# Patient Record
Sex: Male | Born: 1965 | Race: Black or African American | Hispanic: No | Marital: Single | Smoking: Never smoker
Health system: Southern US, Community
[De-identification: ages and names within clinical notes are randomized; demographics above are authoritative.]

## PROBLEM LIST (undated history)

## (undated) DIAGNOSIS — I10 Essential (primary) hypertension: Secondary | ICD-10-CM

## (undated) DIAGNOSIS — M5126 Other intervertebral disc displacement, lumbar region: Secondary | ICD-10-CM

## (undated) DIAGNOSIS — E785 Hyperlipidemia, unspecified: Secondary | ICD-10-CM

## (undated) DIAGNOSIS — E119 Type 2 diabetes mellitus without complications: Secondary | ICD-10-CM

## (undated) HISTORY — PX: TONSILLECTOMY: SUR1361

## (undated) HISTORY — DX: Type 2 diabetes mellitus without complications: E11.9

## (undated) HISTORY — DX: Hyperlipidemia, unspecified: E78.5

## (undated) HISTORY — DX: Essential (primary) hypertension: I10

## (undated) HISTORY — DX: Other intervertebral disc displacement, lumbar region: M51.26

## (undated) HISTORY — PX: APPENDECTOMY: SHX54

---

## 2005-07-30 ENCOUNTER — Emergency Department (HOSPITAL_COMMUNITY): Admission: EM | Admit: 2005-07-30 | Discharge: 2005-07-30 | Payer: Self-pay | Admitting: Emergency Medicine
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
## 2006-09-18 ENCOUNTER — Ambulatory Visit: Payer: Self-pay | Admitting: Family Medicine

## 2006-10-18 ENCOUNTER — Ambulatory Visit: Payer: Self-pay | Admitting: Family Medicine

## 2006-10-19 ENCOUNTER — Encounter (INDEPENDENT_AMBULATORY_CARE_PROVIDER_SITE_OTHER): Payer: Self-pay | Admitting: Family Medicine

## 2006-10-19 LAB — CONVERTED CEMR LAB
Albumin: 4.2 g/dL
BUN: 13 mg/dL
Blood Glucose, Fasting: 100 mg/dL
Calcium: 8.9 mg/dL
Chloride: 106 meq/L
Cholesterol: 174 mg/dL
Creatinine, Ser: 0.85 mg/dL
HCT: 43.9 %
HDL: 30 mg/dL
Hemoglobin: 14.1 g/dL
LDL Cholesterol: 94 mg/dL
MCHC: 32.1 g/dL
MCV: 77 fL
Platelets: 353 10*3/uL
Potassium: 4.3 meq/L
RBC count: 5.7 10*6/uL
RBC: 5.7 M/uL
RDW: 16.1 %
Sodium: 136 meq/L
TSH: 3.166 microintl units/mL
Total Bilirubin: 0.2 mg/dL
Total CHOL/HDL Ratio: 5.8
Total Protein: 7.9 g/dL
Triglycerides: 248 mg/dL
VLDL: 50 mg/dL
WBC, blood: 10.5 10*3/uL
WBC: 10.5 10*3/uL

## 2006-11-22 ENCOUNTER — Ambulatory Visit: Payer: Self-pay | Admitting: Family Medicine

## 2007-01-31 ENCOUNTER — Emergency Department (HOSPITAL_COMMUNITY): Admission: EM | Admit: 2007-01-31 | Discharge: 2007-01-31 | Payer: Self-pay | Admitting: Emergency Medicine

## 2007-02-14 ENCOUNTER — Encounter: Payer: Self-pay | Admitting: Family Medicine

## 2007-02-14 ENCOUNTER — Ambulatory Visit: Payer: Self-pay | Admitting: Family Medicine

## 2007-02-14 DIAGNOSIS — K219 Gastro-esophageal reflux disease without esophagitis: Secondary | ICD-10-CM | POA: Insufficient documentation

## 2007-02-14 DIAGNOSIS — J301 Allergic rhinitis due to pollen: Secondary | ICD-10-CM | POA: Insufficient documentation

## 2007-02-14 DIAGNOSIS — I10 Essential (primary) hypertension: Secondary | ICD-10-CM | POA: Insufficient documentation

## 2007-02-14 DIAGNOSIS — E785 Hyperlipidemia, unspecified: Secondary | ICD-10-CM | POA: Insufficient documentation

## 2007-02-14 LAB — CONVERTED CEMR LAB
Cholesterol, target level: 200 mg/dL
HDL goal, serum: 40 mg/dL
LDL Goal: 160 mg/dL

## 2007-03-18 ENCOUNTER — Encounter (INDEPENDENT_AMBULATORY_CARE_PROVIDER_SITE_OTHER): Payer: Self-pay | Admitting: Family Medicine

## 2007-03-20 ENCOUNTER — Telehealth (INDEPENDENT_AMBULATORY_CARE_PROVIDER_SITE_OTHER): Payer: Self-pay | Admitting: Family Medicine

## 2007-03-22 ENCOUNTER — Encounter (INDEPENDENT_AMBULATORY_CARE_PROVIDER_SITE_OTHER): Payer: Self-pay | Admitting: Family Medicine

## 2007-05-16 ENCOUNTER — Encounter (INDEPENDENT_AMBULATORY_CARE_PROVIDER_SITE_OTHER): Payer: Self-pay | Admitting: Family Medicine

## 2007-05-17 ENCOUNTER — Ambulatory Visit: Payer: Self-pay | Admitting: Family Medicine

## 2007-05-17 DIAGNOSIS — E669 Obesity, unspecified: Secondary | ICD-10-CM | POA: Insufficient documentation

## 2007-05-17 LAB — CONVERTED CEMR LAB: LDL Goal: 130 mg/dL

## 2007-06-10 ENCOUNTER — Encounter (INDEPENDENT_AMBULATORY_CARE_PROVIDER_SITE_OTHER): Payer: Self-pay | Admitting: Family Medicine

## 2007-08-09 ENCOUNTER — Ambulatory Visit: Payer: Self-pay | Admitting: Family Medicine

## 2007-08-16 ENCOUNTER — Encounter (INDEPENDENT_AMBULATORY_CARE_PROVIDER_SITE_OTHER): Payer: Self-pay | Admitting: Family Medicine

## 2007-08-17 LAB — CONVERTED CEMR LAB
ALT: 16 units/L (ref 0–53)
AST: 17 units/L (ref 0–37)
Albumin: 4.2 g/dL (ref 3.5–5.2)
Alkaline Phosphatase: 86 units/L (ref 39–117)
BUN: 10 mg/dL (ref 6–23)
Bilirubin Urine: NEGATIVE
CO2: 22 meq/L (ref 19–32)
Calcium: 9.3 mg/dL (ref 8.4–10.5)
Chloride: 105 meq/L (ref 96–112)
Cholesterol: 172 mg/dL (ref 0–200)
Creatinine, Ser: 0.89 mg/dL (ref 0.40–1.50)
Glucose, Bld: 81 mg/dL (ref 70–99)
HDL: 30 mg/dL — ABNORMAL LOW (ref >39)
Hemoglobin, Urine: NEGATIVE
Ketones, ur: NEGATIVE mg/dL
LDL Cholesterol: 109 mg/dL — ABNORMAL HIGH (ref 0–99)
Leukocytes, UA: NEGATIVE
Nitrite: NEGATIVE
Potassium: 4.3 meq/L (ref 3.5–5.3)
Protein, ur: NEGATIVE mg/dL
Sodium: 139 meq/L (ref 135–145)
Specific Gravity, Urine: 1.025 (ref 1.005–1.03)
Total Bilirubin: 0.3 mg/dL (ref 0.3–1.2)
Total CHOL/HDL Ratio: 5.7
Total Protein: 8 g/dL (ref 6.0–8.3)
Triglycerides: 163 mg/dL — ABNORMAL HIGH (ref <150)
Urine Glucose: NEGATIVE mg/dL
Urobilinogen, UA: 0.2 (ref 0.0–1.0)
VLDL: 33 mg/dL (ref 0–40)
pH: 5.5 (ref 5.0–8.0)

## 2007-08-19 ENCOUNTER — Telehealth (INDEPENDENT_AMBULATORY_CARE_PROVIDER_SITE_OTHER): Payer: Self-pay | Admitting: *Deleted

## 2007-11-08 ENCOUNTER — Ambulatory Visit: Payer: Self-pay | Admitting: Family Medicine

## 2007-11-09 ENCOUNTER — Encounter (INDEPENDENT_AMBULATORY_CARE_PROVIDER_SITE_OTHER): Payer: Self-pay | Admitting: Family Medicine

## 2007-11-11 LAB — CONVERTED CEMR LAB
HCV Ab: NEGATIVE
Hep A IgM: NEGATIVE
Hep B C IgM: NEGATIVE
Hepatitis B Surface Ag: NEGATIVE

## 2007-11-12 LAB — CONVERTED CEMR LAB
Chlamydia, DNA Probe: NEGATIVE
GC Probe Amp, Genital: NEGATIVE

## 2007-11-28 ENCOUNTER — Ambulatory Visit: Payer: Self-pay | Admitting: Family Medicine

## 2008-03-27 ENCOUNTER — Ambulatory Visit: Payer: Self-pay | Admitting: Family Medicine

## 2008-03-28 ENCOUNTER — Encounter (INDEPENDENT_AMBULATORY_CARE_PROVIDER_SITE_OTHER): Payer: Self-pay | Admitting: Family Medicine

## 2008-03-30 LAB — CONVERTED CEMR LAB
BUN: 17 mg/dL (ref 6–23)
CO2: 19 meq/L (ref 19–32)
Calcium: 9.1 mg/dL (ref 8.4–10.5)
Chloride: 104 meq/L (ref 96–112)
Creatinine, Ser: 1.03 mg/dL (ref 0.40–1.50)
Glucose, Bld: 99 mg/dL (ref 70–99)
Potassium: 4.1 meq/L (ref 3.5–5.3)
Sodium: 138 meq/L (ref 135–145)

## 2008-05-01 ENCOUNTER — Ambulatory Visit: Payer: Self-pay | Admitting: Family Medicine

## 2008-07-27 ENCOUNTER — Ambulatory Visit: Payer: Self-pay | Admitting: Family Medicine

## 2008-08-03 ENCOUNTER — Encounter (INDEPENDENT_AMBULATORY_CARE_PROVIDER_SITE_OTHER): Payer: Self-pay | Admitting: Family Medicine

## 2008-08-10 LAB — CONVERTED CEMR LAB
ALT: 22 units/L (ref 0–53)
AST: 19 units/L (ref 0–37)
Albumin: 4.1 g/dL (ref 3.5–5.2)
Alkaline Phosphatase: 83 units/L (ref 39–117)
BUN: 11 mg/dL (ref 6–23)
Basophils Absolute: 0 10*3/uL (ref 0.0–0.1)
Basophils Relative: 0 % (ref 0–1)
CO2: 25 meq/L (ref 19–32)
Calcium: 9.1 mg/dL (ref 8.4–10.5)
Chloride: 100 meq/L (ref 96–112)
Cholesterol: 171 mg/dL (ref 0–200)
Creatinine, Ser: 0.89 mg/dL (ref 0.40–1.50)
Eosinophils Absolute: 0.2 10*3/uL (ref 0.0–0.7)
Eosinophils Relative: 2 % (ref 0–5)
Glucose, Bld: 104 mg/dL — ABNORMAL HIGH (ref 70–99)
HCT: 43.9 % (ref 39.0–52.0)
HDL: 31 mg/dL — ABNORMAL LOW (ref >39)
Hemoglobin: 13.7 g/dL (ref 13.0–17.0)
LDL Cholesterol: 98 mg/dL (ref 0–99)
Lymphocytes Relative: 29 % (ref 12–46)
Lymphs Abs: 2.7 10*3/uL (ref 0.7–4.0)
MCHC: 31.2 g/dL (ref 30.0–36.0)
MCV: 78.7 fL (ref 78.0–100.0)
Monocytes Absolute: 0.7 10*3/uL (ref 0.1–1.0)
Monocytes Relative: 7 % (ref 3–12)
Neutro Abs: 5.8 10*3/uL (ref 1.7–7.7)
Neutrophils Relative %: 63 % (ref 43–77)
PSA: 1.38 ng/mL (ref 0.10–4.00)
Platelets: 352 10*3/uL (ref 150–400)
Potassium: 4.3 meq/L (ref 3.5–5.3)
RBC: 5.58 M/uL (ref 4.22–5.81)
RDW: 16 % — ABNORMAL HIGH (ref 11.5–15.5)
Sodium: 136 meq/L (ref 135–145)
TSH: 3.787 microintl units/mL (ref 0.350–4.50)
Total Bilirubin: 0.2 mg/dL — ABNORMAL LOW (ref 0.3–1.2)
Total CHOL/HDL Ratio: 5.5
Total Protein: 7.9 g/dL (ref 6.0–8.3)
Triglycerides: 208 mg/dL — ABNORMAL HIGH (ref <150)
VLDL: 42 mg/dL — ABNORMAL HIGH (ref 0–40)
WBC: 9.3 10*3/uL (ref 4.0–10.5)

## 2008-09-21 ENCOUNTER — Ambulatory Visit: Payer: Self-pay | Admitting: Family Medicine

## 2008-12-14 ENCOUNTER — Ambulatory Visit: Payer: Self-pay | Admitting: Family Medicine

## 2009-01-16 ENCOUNTER — Emergency Department (HOSPITAL_COMMUNITY): Admission: EM | Admit: 2009-01-16 | Discharge: 2009-01-16 | Payer: Self-pay | Admitting: Emergency Medicine

## 2009-01-18 ENCOUNTER — Ambulatory Visit: Payer: Self-pay | Admitting: Family Medicine

## 2009-01-26 ENCOUNTER — Ambulatory Visit (HOSPITAL_COMMUNITY): Admission: RE | Admit: 2009-01-26 | Discharge: 2009-01-26 | Payer: Self-pay | Admitting: Family Medicine

## 2009-01-26 ENCOUNTER — Ambulatory Visit: Payer: Self-pay | Admitting: Family Medicine

## 2009-03-15 ENCOUNTER — Ambulatory Visit: Payer: Self-pay | Admitting: Family Medicine

## 2009-03-20 ENCOUNTER — Encounter (INDEPENDENT_AMBULATORY_CARE_PROVIDER_SITE_OTHER): Payer: Self-pay | Admitting: Family Medicine

## 2009-03-22 LAB — CONVERTED CEMR LAB
ALT: 26 units/L (ref 0–53)
AST: 20 units/L (ref 0–37)
Albumin: 4.3 g/dL (ref 3.5–5.2)
Alkaline Phosphatase: 76 units/L (ref 39–117)
BUN: 9 mg/dL (ref 6–23)
CO2: 24 meq/L (ref 19–32)
Calcium: 8.8 mg/dL (ref 8.4–10.5)
Chloride: 101 meq/L (ref 96–112)
Cholesterol: 170 mg/dL (ref 0–200)
Creatinine, Ser: 0.92 mg/dL (ref 0.40–1.50)
Glucose, Bld: 103 mg/dL — ABNORMAL HIGH (ref 70–99)
HDL: 30 mg/dL — ABNORMAL LOW (ref >39)
LDL Cholesterol: 98 mg/dL (ref 0–99)
Potassium: 4 meq/L (ref 3.5–5.3)
Sodium: 139 meq/L (ref 135–145)
Total Bilirubin: 0.2 mg/dL — ABNORMAL LOW (ref 0.3–1.2)
Total CHOL/HDL Ratio: 5.7
Total Protein: 7.6 g/dL (ref 6.0–8.3)
Triglycerides: 211 mg/dL — ABNORMAL HIGH (ref <150)
VLDL: 42 mg/dL — ABNORMAL HIGH (ref 0–40)

## 2009-05-03 ENCOUNTER — Ambulatory Visit: Payer: Self-pay | Admitting: Family Medicine

## 2009-07-14 ENCOUNTER — Ambulatory Visit: Payer: Self-pay | Admitting: Family Medicine

## 2009-08-09 LAB — CONVERTED CEMR LAB
ALT: 18 units/L (ref 0–53)
AST: 21 units/L (ref 0–37)
Albumin: 4 g/dL (ref 3.5–5.2)
Alkaline Phosphatase: 75 units/L (ref 39–117)
BUN: 9 mg/dL (ref 6–23)
Basophils Absolute: 0 10*3/uL (ref 0.0–0.1)
Basophils Relative: 0 % (ref 0–1)
CO2: 25 meq/L (ref 19–32)
Calcium: 9.2 mg/dL (ref 8.4–10.5)
Chloride: 101 meq/L (ref 96–112)
Cholesterol: 180 mg/dL (ref 0–200)
Creatinine, Ser: 1.05 mg/dL (ref 0.40–1.50)
Eosinophils Absolute: 0.1 10*3/uL (ref 0.0–0.7)
Eosinophils Relative: 2 % (ref 0–5)
Glucose, Bld: 106 mg/dL — ABNORMAL HIGH (ref 70–99)
HCT: 41.8 % (ref 39.0–52.0)
HDL: 29 mg/dL — ABNORMAL LOW (ref >39)
Hemoglobin: 13.9 g/dL (ref 13.0–17.0)
LDL Cholesterol: 107 mg/dL — ABNORMAL HIGH (ref 0–99)
Lymphocytes Relative: 26 % (ref 12–46)
Lymphs Abs: 2.2 10*3/uL (ref 0.7–4.0)
MCHC: 33.3 g/dL (ref 30.0–36.0)
MCV: 76.1 fL — ABNORMAL LOW (ref 78.0–100.0)
Monocytes Absolute: 0.6 10*3/uL (ref 0.1–1.0)
Monocytes Relative: 7 % (ref 3–12)
Neutro Abs: 5.4 10*3/uL (ref 1.7–7.7)
Neutrophils Relative %: 65 % (ref 43–77)
PSA: 1.09 ng/mL (ref 0.10–4.00)
Platelets: 345 10*3/uL (ref 150–400)
Potassium: 3.8 meq/L (ref 3.5–5.3)
RBC: 5.49 M/uL (ref 4.22–5.81)
RDW: 16.3 % — ABNORMAL HIGH (ref 11.5–15.5)
Sodium: 137 meq/L (ref 135–145)
TSH: 2.561 microintl units/mL (ref 0.350–4.500)
Total Bilirubin: 0.3 mg/dL (ref 0.3–1.2)
Total CHOL/HDL Ratio: 6.2
Total Protein: 7.7 g/dL (ref 6.0–8.3)
Triglycerides: 222 mg/dL — ABNORMAL HIGH (ref <150)
VLDL: 44 mg/dL — ABNORMAL HIGH (ref 0–40)
WBC: 8.3 10*3/uL (ref 4.0–10.5)

## 2009-08-20 ENCOUNTER — Ambulatory Visit: Payer: Self-pay | Admitting: Family Medicine

## 2009-08-20 DIAGNOSIS — R7309 Other abnormal glucose: Secondary | ICD-10-CM | POA: Insufficient documentation

## 2011-01-20 ENCOUNTER — Emergency Department (HOSPITAL_COMMUNITY)
Admission: EM | Admit: 2011-01-20 | Discharge: 2011-01-20 | Payer: Self-pay | Source: Home / Self Care | Admitting: Emergency Medicine

## 2011-01-20 ENCOUNTER — Encounter: Payer: Self-pay | Admitting: Orthopedic Surgery

## 2011-01-24 NOTE — Assessment & Plan Note (Signed)
Summary: STITCHES REMOVED FROM HAND/SLJ   Vital Signs:  Patient Profile:   45 Years Old Male Height:     72 inches O2 Sat:      98 % Pulse rate:   76 / minute Resp:     14 per minute BP sitting:   137 / 86  Vitals Entered By: Sherilyn Banker LPN (January 26, 2009 3:19 PM)                 PCP:  Franchot Heidelberg, MD  Chief Complaint:  needs stitches removed.  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  He was seen with thumb infection and was started on Doxycycline. He has complted course and adds then not sure. Adds mde him constipated and this has cleared. He denies fever, chills and sweats. He denies numbness and tingling and states hand itchy now. He denies drainage and discharge from sutured areas but state area on thumb plantar surface has minimal drainage - states no longer pussy and more serosanguinois. Adds thumb feels a little different and states hard to describe as feels less senstive on dursum.  States has felt this way since he fell. He did not have hand films at ED. He denies pain. He can move fingers without difficulyt. States overall quite a bit better.  He states cannot move right thmb much as swollen. Has been this way since accident.  No pain though.   He is right hand dominant.  He now presents.    Prior Medications Reviewed Using: Patient Recall  Updated Prior Medication List: DILTIAZEM HCL ER BEADS 420 MG  XR24H-CAP (DILTIAZEM HCL ER BEADS) One daily LISINOPRIL-HYDROCHLOROTHIAZIDE 20-25 MG  TABS (LISINOPRIL-HYDROCHLOROTHIAZIDE) One daily DOXYCYCLINE HYCLATE 100 MG TABS (DOXYCYCLINE HYCLATE) two times a day  Current Allergies (reviewed today): No known allergies   Past Medical History:    Reviewed history from 07/27/2008 and no changes required:       SEXUAL ACTIVITY, HIGH RISK (ICD-V69.2)       OBESITY NOS (ICD-278.00)       ALLERGIC RHINITIS, SEASONAL (ICD-477.0)  HYPERLIPIDEMIA (ICD-272.4)       HYPERTENSION (ICD-401.9)       GERD (ICD-530.81)         Past Surgical History:    Reviewed history from 02/14/2007 and no changes required:       Appendectomy-12/19/99       Tonsillectomy    Risk Factors: Tobacco use:  never Drug use:  no Alcohol use:  no  Family History Risk Factors:    Family History of MI in females < 36 years old:  no    Family History of MI in males < 61 years old:  no   Review of Systems      See HPI   Physical Exam  General:     Well-developed,well-nourished,in no acute distress; alert,appropriate and cooperative throughout examination Lungs:     Normal respiratory effort, chest expands symmetrically. Lungs are clear to auscultation, no crackles or wheezes. Heart:     Normal rate and regular rhythm. S1 and S2 normal without gallop, murmur, click, rub or other extra sounds. Msk:     Right hand shows 2 cm half moon lack dorsum of right thumb. Healed. 4 sutures here removed. Good wound approximation. Dry serosanguinous liquid. Palmar surface of thumb shows 1 cm lac with minimal serosanguinous draiange. No infection. His palm shows 2 cm lac  between web space of forefinger and index finger. 5 sutures removed here. Healing. No infection. Good ROM in fingers but cannot flex thumb due to swelling. Normal cap fill and radial/ulnar pulses. Extremities:     No clubbing, cyanosis, edema, or deformity noted with normal full range of motion of all joints.   Cervical Nodes:     No lymphadenopathy noted Psych:     Cognition and judgment appear intact. Alert and cooperative with normal attention span and concentration. No apparent delusions, illusions, hallucinations    Impression & Recommendations:  Problem # 1:  CELLULITIS, HAND, RIGHT (ICD-682.4) Resolved. Assure he completed all abx. Councelled abx ointment two times a day until completely healed. Agrees. His updated medication list for this problem includes:     Doxycycline Hyclate 100 Mg Tabs (Doxycycline hyclate) .Marland Kitchen..Marland Kitchen Two times a day   Problem # 2:  LACERATION, HAND, RIGHT (ICD-882.0) Sutures removed without complication. Rx as above. Update if concerns. Orders: T-DG Finger Thumb*R* ([id number]) EMR Misc Charge Code [facility])   Problem # 3:  THUMB PAIN, RIGHT (ICD-729.5) He has swelling and strange sesnation he cannot pinpoint. He caught himself falling and question thumb sprain vs ligament injury vs occult bony injury. Get plain films. Advised rst, ice, elevation. If symptoms persist in one week, update and refer Ortho. If resolved, update as well . Agrees. Orders: T-DG Finger Thumb*R* ([id number])   Complete Medication List: 1)  Diltiazem Hcl Er Beads 420 Mg Xr24h-cap (Diltiazem hcl er beads) .... One daily 2)  Lisinopril-hydrochlorothiazide 20-25 Mg Tabs (Lisinopril-hydrochlorothiazide) .... One daily 3)  Doxycycline Hyclate 100 Mg Tabs (Doxycycline hyclate) .... Two times a day   Patient Instructions: 1)  Please schedule a follow-up appointment in 1 weeks - sooner if any concerns.

## 2011-01-24 NOTE — Assessment & Plan Note (Signed)
Summary: 6 wk follow up bld work/cnd   Vital Signs:  Patient profile:   45 year old male Height:      72 inches Weight:      288 pounds BMI:     39.20 O2 Sat:      99 % Pulse rate:   78 / minute Resp:     12 per minute BP sitting:   140 / 86  Vitals Entered By: Sherilyn Banker LPN (May 03, 2009 3:59 PM)  Nutrition Counseling: Patient's BMI is greater than 25 and therefore counseled on weight management options. CC: follow-up visit, Hypertension Management, Lipid Management   Primary Provider:  Franchot Heidelberg, MD  CC:  follow-up visit, Hypertension Management, and Lipid Management.  History of Present Illness: Pt in for recheck.  He had recetn labs and this is reviewed:  1. CMP - BS 103. He denies personal hx of DM and notes GM had this. Usnure of ages at diagnosis.  He denies excessive thrist, polyphagia and polydipsia. He is exersizing and states wathcing diet closer. He adds walks 4 miles five days a week. He has lost 3 pounds since last visit.  2. Lipids -  see below. He did not start Niaspan.  Wanted to talk to me first as was unsure as to use.  He now presents.  Hypertension History:      He denies headache, chest pain, palpitations, dyspnea with exertion, orthopnea, PND, peripheral edema, visual symptoms, neurologic problems, syncope, and side effects from treatment.  He notes no problems with any antihypertensive medication side effects.  Further comments include: He is nervous today about not using Niaspan.        Positive major cardiovascular risk factors include hyperlipidemia and hypertension.  Negative major cardiovascular risk factors include male age less than 66 years old, no history of diabetes, negative family history for ischemic heart disease, and non-tobacco-user status.        Further assessment for target organ damage reveals no history of ASHD, stroke/TIA, or peripheral vascular disease.    Lipid Management History:      Positive NCEP/ATP III risk factors  include HDL cholesterol less than 40 and hypertension.  Negative NCEP/ATP III risk factors include male age less than 76 years old, non-diabetic, no family history for ischemic heart disease, non-tobacco-user status, no ASHD (atherosclerotic heart disease), no prior stroke/TIA, no peripheral vascular disease, and no history of aortic aneurysm.        The patient states that he knows about the "Therapeutic Lifestyle Change" diet.  His compliance with the TLC diet is fair.      Preventive Screening-Counseling & Management     Alcohol drinks/day: 0     Smoking Status: never  Current Problems (verified): 1)  Obesity Nos  (ICD-278.00) 2)  Allergic Rhinitis, Seasonal  (ICD-477.0) 3)  Hyperlipidemia  (ICD-272.4) 4)  Hypertension  (ICD-401.9) 5)  Gerd  (ICD-530.81)  Current Medications (verified): 1)  Diltiazem Hcl Er Beads 420 Mg  Xr24h-Cap (Diltiazem Hcl Er Beads) .... One Daily 2)  Lisinopril-Hydrochlorothiazide 20-25 Mg  Tabs (Lisinopril-Hydrochlorothiazide) .... One Daily 3)  Niaspan 500 Mg Cr-Tabs (Niacin (Antihyperlipidemic)) .... Once Daily 4)  Adult Aspirin Ec Low Strength 81 Mg Tbec (Aspirin) .... 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    Single, lives alone - has girlfriend    Never Smoked    Alcohol use-no    Drug use-no     (12/14/2008)  Risk Factors:    Alcohol Use: 0 (03/15/2009)    >5 drinks/d w/in last 3 months: N/A     Caffeine Use: N/A    Diet: N/A    Exercise: N/A  Risk Factors:    Smoking Status: never (03/15/2009)    Packs/Day: N/A    Cigars/wk: N/A    Pipe Use/wk: N/A    Cans of tobacco/wk: N/A    Passive Smoke Exposure: N/A  Family History:    Reviewed history from 12/14/2008 and no changes required:       Father: 32 W&L       Mother: 77 HTN       Siblings: Sisters x 2: 74 and 44 W&L       Brothers - none       KIds - none  Social History:    Reviewed history from 12/14/2008 and no changes required:       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 Resp:  Denies cough, shortness of breath, sputum productive, and wheezing. GI:  Denies abdominal pain, constipation, diarrhea, nausea, and vomiting. GU:  Denies nocturia.  Physical Exam  General:  Well-developed,well-nourished,in no acute distress; alert,appropriate and cooperative throughout examination Lungs:  Normal respiratory effort, chest expands symmetrically. Lungs are clear to auscultation, no crackles or wheezes. Heart:  Normal rate and regular rhythm. S1 and S2 normal without gallop, murmur, click, rub or other extra sounds. Abdomen:  Bowel sounds positive,abdomen soft and non-tender without masses, organomegaly or hernias noted. Extremities:  No clubbing, cyanosis, edema, or deformity noted with normal full range of motion of all joints.  Well healed scars right hand. Cervical Nodes:  No lymphadenopathy noted Psych:  Cognition and judgment appear intact. Alert and cooperative with normal attention span and concentration. No apparent delusions, illusions, hallucinations   Impression & Recommendations:  Problem # 1:  HYPERLIPIDEMIA (ICD-272.4) Discussed. Low HDL for 3 years and [place] above 150 goal. Reviewed ATP III  goals, TLC diet and daily exersize. Add Niaspan. Discussed risk and benefit as well as method of use. Update if side-effects or concerns.  His updated medication list for this problem includes:    Niaspan 500 Mg Cr-tabs (Niacin (antihyperlipidemic)) ..... Once daily  Problem # 2:  HYPERTENSION (ICD-401.9) Rx as below. Nervous today as not taking RX as directed i.e. Niaspan. Reassured. Recheck 2 months.  His updated medication list for this problem includes:    Diltiazem Hcl Er Beads 420 Mg Xr24h-cap (Diltiazem hcl er beads) ..... One daily  Lisinopril-hydrochlorothiazide 20-25 Mg Tabs (Lisinopril-hydrochlorothiazide) ..... One daily  Problem # 3:  OBESITY NOS (ICD-278.00) Happy with weight loss. Urged to stay course. Agreeable. Councelled health risk and benefit of weight loss espescially in lieu of hyperglycemia fasting with fam hx of DM.  Complete Medication List: 1)  Diltiazem Hcl Er Beads 420 Mg Xr24h-cap (Diltiazem hcl er beads) .... One daily 2)  Lisinopril-hydrochlorothiazide 20-25 Mg Tabs (Lisinopril-hydrochlorothiazide) .... One daily 3)  Niaspan 500 Mg Cr-tabs (Niacin (antihyperlipidemic)) .... Once daily 4)  Adult Aspirin Ec Low Strength 81 Mg Tbec (Aspirin) .... Once daily  Hypertension Assessment/Plan:      The patient's hypertensive risk group is category B: At least one risk factor (excluding diabetes) with no target organ damage.  His calculated 10 year risk of coronary heart disease is 6 %.  Today's blood pressure is 140/86.  His blood pressure goal is < 140/90.  Lipid Assessment/Plan:      Based on NCEP/ATP III, the patient's risk factor category is "2 or more risk factors and a calculated 10 year CAD risk of < 20%".  From this information, the patient's calculated lipid goals are as follows: Total cholesterol goal is 200; LDL cholesterol goal is 130; HDL cholesterol goal is 40; Triglyceride goal is 150.    Patient Instructions: 1)  Please schedule a follow-up  appointment in 2 months.

## 2011-01-24 NOTE — Medication Information (Signed)
Summary: Tax adviser   Imported By: Donneta Romberg 06/10/2007 16:08:47  _____________________________________________________________________  External Attachment:    Type:   Image     Comment:   External Document

## 2011-01-24 NOTE — Assessment & Plan Note (Signed)
Summary: right hand wants you to look at it /slj   Vital Signs:  Patient Profile:   45 Years Old Male Height:     72 inches Weight:      292 pounds BMI:     39.75 O2 Sat:      99 % Pulse rate:   82 / minute Resp:     14 per minute BP sitting:   137 / 86  Vitals Entered By: Sherilyn Banker (January 18, 2009 3:28 PM)                 PCP:  Franchot Heidelberg, MD  Chief Complaint:  possible infection and R thumb.  History of Present Illness: Pt in for acute visit.  He states he wants me to look at his right hand. He was walking down steps aty house and fell down the steps - 4 total. He tried to catch self on steps and as he did tore skin open over hand. States went to [facility]. He had 9 sutures placed and was give TDaP injection. He states was told to use abx ointment as well as neosporin. He states was instructed on infection signs and told to update if this occurs.  He states when he chnaged dressing this am saw some puss on a wound on the back of his hand. States was yellow and had blood in it.He states hand hurts and using Hydrocodone for pain. He was seen by Tammy Tripplet. He denies redness and states feels betetr than it has. Just worried with puss and wants me to look at it.  He now presents.    Prior Medications Reviewed Using: Patient Recall  Updated Prior Medication List: DILTIAZEM HCL ER BEADS 420 MG  XR24H-CAP (DILTIAZEM HCL ER BEADS) One daily LISINOPRIL-HYDROCHLOROTHIAZIDE 20-25 MG  TABS (LISINOPRIL-HYDROCHLOROTHIAZIDE) One daily  Current Allergies (reviewed today): No known allergies      Review of Systems      See HPI  General      Denies chills, fever, and sweats.  Resp      Denies cough, shortness of breath, sputum productive, and wheezing.  GI      Denies abdominal pain, constipation, diarrhea, nausea, and vomiting.  GU      Denies nocturia, urinary frequency, and urinary hesitancy.   Physical Exam  General:  Well-developed,well-nourished,in no acute distress; alert,appropriate and cooperative throughout examination Msk:     Right hand shows 2 cm half moon lack dorsum of right thumb. Good wound approximation. Dry serosanguinous liquid. Plamr surface of thum shows 1 cm lac with pussgy fluid draining and mild redness. His palm shows 2 cm lac between web space of forefinger and index finger. One suture already pulled out. No infection here.    Impression & Recommendations:  Problem # 1:  CELLULITIS, HAND, RIGHT (ICD-682.4) Start doxy. Advised method of use and councelled risk and benefit. Susepct secondary to fall and scrape with hand lac. Advised abx ointment two times a day. Avoid soaking hand and wash with soap and water only. Cont to monitor anjd update immediately if worse. Agrees.  His updated medication list for this problem includes:    Doxycycline Hyclate 100 Mg Tabs (Doxycycline hyclate) .Marland Kitchen..Marland Kitchen Two times a day   Problem # 2:  LACERATION, HAND, RIGHT (ICD-882.0) Stable. Rx as above. Cont wound care. Sutures out in 10 days i.e. next Tuesday.   Complete Medication List: 1)  Diltiazem Hcl Er Beads 420 Mg Xr24h-cap (Diltiazem hcl er beads) .Marland KitchenMarland KitchenMarland Kitchen  One daily 2)  Lisinopril-hydrochlorothiazide 20-25 Mg Tabs (Lisinopril-hydrochlorothiazide) .... One daily 3)  Doxycycline Hyclate 100 Mg Tabs (Doxycycline hyclate) .... Two times a day    Prescriptions: DOXYCYCLINE HYCLATE 100 MG TABS (DOXYCYCLINE HYCLATE) two times a day  #14 x 0   Entered and Authorized by:   Franchot Heidelberg MD   Signed by:   Franchot Heidelberg MD on 01/18/2009   Method used:   Print then Give to Patient   RxID:   [id number]    Preventive Care Screening  Last Tetanus Booster:    Date:  01/16/2009    Next Due:  01/2019    Results:  Hx of

## 2011-01-24 NOTE — Assessment & Plan Note (Signed)
Summary: 3 MONTH FOLLOW UP/ARC   Vital Signs:  Patient Profile:   45 Years Old Male Weight:      285 pounds O2 Sat:      98 % Temp:     98 degrees F Pulse rate:   65 / minute Resp:     16 per minute BP sitting:   136 / 88               Visit Type:  Follow-up  Chief Complaint:  F/up.  Hypertension History:      He denies headache, chest pain, palpitations, dyspnea with exertion, orthopnea, PND, peripheral edema, visual symptoms, neurologic problems, syncope, and side effects from treatment.  He notes no problems with any antihypertensive medication side effects.  Further comments include: Had ED visit two weeks ago - heart was racing. Had EKG and heart enzymes - all was normal. Nox sx since.  Just felt anxious. Had a lotof stress from working overtime. Notes he has had this before - just not this bad.        Positive major cardiovascular risk factors include hyperlipidemia and hypertension.  Negative major cardiovascular risk factors include male age less than 60 years old, no history of diabetes, negative family history for ischemic heart disease, and non-tobacco-user status.        Further assessment for target organ damage reveals no history of ASHD, stroke/TIA, or peripheral vascular disease.    Lipid Management History:      Positive NCEP/ATP III risk factors include hypertension.  Negative NCEP/ATP III risk factors include male age less than 93 years old, non-diabetic, no family history for ischemic heart disease, non-tobacco-user status, no ASHD (atherosclerotic heart disease), no prior stroke/TIA, no peripheral vascular disease, and no history of aortic aneurysm.        The patient states that he knows about the "Therapeutic Lifestyle Change" diet.  His compliance with the TLC diet is poor.  The patient expresses understanding of adjunctive measures for cholesterol lowering.  Adjunctive measures started by the patient include aerobic exercise, fiber, and omega-3 supplements.   Comments: Pt not following recommendations. Notes he needs to get on the program. Will try harder. Lipids October: TC 174, Trig 248, HDL 30, LDL 94.    Prior Medications: Current Allergies (reviewed today): No known allergies   Past Surgical History:    Appendectomy-12/19/99    Tonsillectomy   Family History:    Father: 23 W&L    Mother: 73 HTN    Siblings: Sisters x 2: 57 and 64 W&L  Social History:    Occupation: [employer]es     Single    Never Smoked    Alcohol use-no    Drug use-no   Risk Factors:  Tobacco use:  never Drug use:  no Alcohol use:  no  Family History Risk Factors:    Family History of MI in females < 48 years old:  no    Family History of MI in males < 51 years old:  no   Review of Systems  General      Denies chills, fever, and sweats.  CV      Denies chest pain or discomfort, palpitations, swelling of feet, swelling of hands, and weight gain.  Resp      Denies chest discomfort, shortness of breath, sputum productive, and wheezing.  GI      Denies abdominal pain, constipation, diarrhea, nausea, and vomiting.  GU      Denies dysuria, nocturia,  urinary frequency, and urinary hesitancy.  Heme      Hx low MCV - October 007. Level was 77.   Physical Exam  General:     Well-developed,well-nourished,in no acute distress; alert,appropriate and cooperative throughout examination Lungs:     Normal respiratory effort, chest expands symmetrically. Lungs are clear to auscultation, no crackles or wheezes. Heart:     Normal rate and regular rhythm. S1 and S2 normal without gallop, murmur, click, rub or other extra sounds. Abdomen:     Bowel sounds positive,abdomen soft and non-tender without masses, organomegaly or hernias noted. Extremities:     No clubbing, cyanosis, edema, or deformity noted with normal full range of motion of all joints.   Psych:     Cognition and judgment appear intact. Alert and cooperative with normal attention span and  concentration. No apparent delusions, illusions, hallucinations    Impression & Recommendations:  Problem # 1:  HYPERTENSION (ICD-401.9) At goal. No change. Diet, exersize and low salt intake a must. If palpitations reoccur, will need holter etc. His updated medication list for this problem includes:    Cardizem La 420 Mg Tb24 (Diltiazem hcl coated beads) ..... One by mouth daily    Micardis Hct 40-12.5 Mg Tabs (Telmisartan-hctz) ..... One by mouth daily   Problem # 2:  HYPERLIPIDEMIA (ICD-272.4) TLC for three months. Then needs repeat CMPand Lipidpanel.  Problem # 3:  GERD (ICD-530.81) Stable. No change  Problem # 4:  ALLERGIC RHINITIS, SEASONAL (ICD-477.0) Sx Rx OTC as needed i.e. Zyrtec, Claritin etc.   Medications Added to Medication List This Visit: 1)  Cardizem La 420 Mg Tb24 (Diltiazem hcl coated beads) .... One by mouth daily 2)  Micardis Hct 40-12.5 Mg Tabs (Telmisartan-hctz) .... One by mouth daily  Hypertension Assessment/Plan:      The patient's hypertensive risk group is category B: At least one risk factor (excluding diabetes) with no target organ damage.  Today's blood pressure is 136/88.  His blood pressure goal is < 140/90.  Lipid Assessment/Plan:      Based on NCEP/ATP III, the patient's risk factor category is "0-1 risk factors".  From this information, the patient's calculated lipid goals are as follows: Total cholesterol goal is 200; LDL cholesterol goal is 160; HDL cholesterol goal is 40; Triglyceride goal is 150.

## 2011-01-24 NOTE — Progress Notes (Signed)
Summary: 08/16/07 lab reslults  Phone Note Outgoing Call   Call placed by: Sonny Dandy,  August 19, 2007 2:42 PM Summary of Call: called, no answer, message left for patient to return call on answering machine .................................................................Marland KitchenMarland KitchenSonny Dandy  August 19, 2007 2:42 PM   Results given to patient, voices understanding .................................................................Marland KitchenMarland KitchenSonny Dandy  August 19, 2007 3:42 PM  Initial call taken by: Sonny Dandy,  August 19, 2007 3:42 PM

## 2011-01-24 NOTE — Assessment & Plan Note (Signed)
Summary: "crick" in neck x1 week...sg   Vital Signs:  Patient Profile:   45 Years Old Male Height:     72 inches O2 Sat:      98 % Pulse rate:   63 / minute Resp:     12 per minute BP sitting:   157 / 86  Vitals Entered By: Sherilyn Banker (May 01, 2008 2:23 PM)                 PCP:  Franchot Heidelberg, MD  Chief Complaint:  necl stiffness since Monday.  History of Present Illness: Pt in for acute visit.  He has had neck pain since Monday. States right side is aweful. Describes as a sharp pain. Worse with turning neck. Better with sitting dead still and not moving it. States localized. Rates pain as 5/10.  He states he went to sleep Sunday night and was fine, woke up Monday am and had pain. He sleeps on regular pillow and has 3 to 4 of them on bed. He denies headache, tremor and weakness. No injury or trauma. States has not self treated except for mucle rub - freeze. Not helping. May ease little bit.  He now presents.    Prior Medications Reviewed Using: Patient Recall  Updated Prior Medication List: CARDIZEM LA 420 MG TB24 (DILTIAZEM HCL COATED BEADS) One by mouth daily MICARDIS HCT 40-12.5 MG TABS (TELMISARTAN-HCTZ) One by mouth daily  Current Allergies (reviewed today): No known allergies   Past Medical History:    Reviewed history from 03/27/2008 and no changes required:              SEXUAL ACTIVITY, HIGH RISK (ICD-V69.2)       OBESITY NOS (ICD-278.00)       ALLERGIC RHINITIS, SEASONAL (ICD-477.0)       HYPERLIPIDEMIA (ICD-272.4)       HYPERTENSION (ICD-401.9)       GERD (ICD-530.81)         Past Surgical History:    Reviewed history from 02/14/2007 and no changes required:       Appendectomy-12/19/99       Tonsillectomy   Family History:    Reviewed history from 02/14/2007 and no changes required:       Father: 51 W&L       Mother: 31 HTN       Siblings: Sisters x 2: 24 and 25 W&L  Social History:    Reviewed history from 02/14/2007 and no changes  required:       Occupation: [employer]        Single       Never Smoked       Alcohol use-no       Drug use-no   Risk Factors: Tobacco use:  never Drug use:  no Alcohol use:  no  Family History Risk Factors:    Family History of MI in females < 78 years old:  no    Family History of MI in males < 36 years old:  no   Review of Systems      See HPI   Physical Exam  General:     Well-developed,well-nourished,in no acute distress; alert,appropriate and cooperative throughout examination Neck:     Pain right neck. Paraspinous muscle spasm. LImited ROM due to pain. Acne changes noted. Lungs:     Normal respiratory effort, chest expands symmetrically. Lungs are clear to auscultation, no crackles or wheezes. Heart:     Normal rate and regular  rhythm. S1 and S2 normal without gallop, murmur, click, rub or other extra sounds. Abdomen:     Bowel sounds positive,abdomen soft and non-tender without masses, organomegaly or hernias noted. Extremities:     No clubbing, cyanosis, edema, or deformity noted with normal full range of motion of all joints.   Neurologic:     No cranial nerve deficits noted. Station and gait are normal. Plantar reflexes are down-going bilaterally. DTRs are symmetrical throughout. Sensory, motor and coordinative functions appear intact.    Impression & Recommendations:  Problem # 1:  CERVICAL STRAIN (ICD-847.0) Start Flexeril,NSAID and give single dose Toradol. Advised risk and benefit of med use including sedation. Councelled importance of proper pillowuse as sxlikley secondary to sleeping wrong way. Advised [place] last weekend and was exhausted afterwards. Agrees with me he slept wrong as he was so tired. Update if any concern with meds or if sx do not improve. Gradually increase neck ROM exerisze. If persist, refer PT. His updated medication list for this problem includes:    Cyclobenzaprine Hcl 10 Mg Tabs (Cyclobenzaprine hcl) ..... One  three times a day as need for mucle sprain    Ec-naprosyn 500 Mg Tbec (Naproxen) .Marland Kitchen..Marland Kitchen Two times a day  Orders: Ketorolac-Toradol 15mg  (P2951) Admin of Therapeutic Inj  intramuscular or subcutaneous (88416)   Complete Medication List: 1)  Cardizem La 420 Mg Tb24 (Diltiazem hcl coated beads) .... One by mouth daily 2)  Micardis Hct 40-12.5 Mg Tabs (Telmisartan-hctz) .... One by mouth daily 3)  Cyclobenzaprine Hcl 10 Mg Tabs (Cyclobenzaprine hcl) .... One three times a day as need for mucle sprain 4)  Ec-naprosyn 500 Mg Tbec (Naproxen) .... Two times a day   Patient Instructions: 1)  As scheduled. Sooner if worse.   Prescriptions: EC-NAPROSYN 500 MG  TBEC (NAPROXEN) two times a day  #20 x 0   Entered and Authorized by:   Franchot Heidelberg MD   Signed by:   Franchot Heidelberg MD on 05/01/2008   Method used:   Print then Give to Patient   RxID:   [id number] CYCLOBENZAPRINE HCL 10 MG  TABS (CYCLOBENZAPRINE HCL) One three times a day as need for mucle sprain  #15 x 0   Entered and Authorized by:   Franchot Heidelberg MD   Signed by:   Franchot Heidelberg MD on 05/01/2008   Method used:   Print then Give to Patient   RxID:   [id number]  ]  Medication Administration  Injection # 1:    Medication: Ketorolac-Toradol 15mg     Diagnosis: CERVICAL STRAIN (ICD-847.0)    Route: IM    Site: L thigh    Exp Date: 03/25/2009    Mfr: Abraxis    Patient tolerated injection without complications    Given by: Sherilyn Banker (May 01, 2008 3:09 PM)  Orders Added: 1)  Est. Patient Level III [28315] 2)  Ketorolac-Toradol 15mg  [J1885] 3)  Admin of Therapeutic Inj  intramuscular or subcutaneous [17616]

## 2011-01-24 NOTE — Medication Information (Signed)
Summary: cardizem refill  cardizem refill   Imported By: Magdalene River 03/22/2007 10:39:38  _____________________________________________________________________  External Attachment:    Type:   Image     Comment:   External Document

## 2011-01-24 NOTE — Assessment & Plan Note (Signed)
Summary: 3 month follow up/arc   Vital Signs:  Patient Profile:   45 Years Old Male Height:     72 inches Weight:      293 pounds BMI:     39.88 O2 Sat:      97 % Pulse rate:   87 / minute Resp:     14 per minute BP sitting:   140 / 89  Vitals Entered By: Sherilyn Banker (December 14, 2008 3:04 PM)                 PCP:  Franchot Heidelberg, MD  Chief Complaint:  follow up visit.  History of Present Illness: Pt in for recheck.  He notes he is doing well. He states he just needs BP check and denies any concerns.  He states he finally got new glasses and makes world of difference. He is happy with this and states last eye exam few months ago. Denies signs of retinopathy.  He has not seen dentist.  He denies chest pain, SOB, orthopnea, PND, and palpitations. He is watching salt. He is walking for exersize and states does this daily - 2 to 3 miles and does every day.   He is on generic medications and states insurance gives them to him free.  He now presents.    Hypertension History:      He denies headache, chest pain, palpitations, dyspnea with exertion, orthopnea, PND, peripheral edema, visual symptoms, neurologic problems, syncope, and side effects from treatment.  He notes no problems with any antihypertensive medication side effects.  Further comments include: See HPI.        Positive major cardiovascular risk factors include hyperlipidemia and hypertension.  Negative major cardiovascular risk factors include male age less than 58 years old, no history of diabetes, negative family history for ischemic heart disease, and non-tobacco-user status.        Further assessment for target organ damage reveals no history of ASHD, stroke/TIA, or peripheral vascular disease.    Lipid Management History:      Positive NCEP/ATP III risk factors include HDL cholesterol less than 40 and hypertension.  Negative NCEP/ATP III risk factors include male age less than 107 years old, non-diabetic,  no family history for ischemic heart disease, non-tobacco-user status, no ASHD (atherosclerotic heart disease), no prior stroke/TIA, no peripheral vascular disease, and no history of aortic aneurysm.        The patient states that he knows about the "Therapeutic Lifestyle Change" diet.  His compliance with the TLC diet is fair.  The patient expresses understanding of adjunctive measures for cholesterol lowering.  Comments: He has modified diet. He is exersizing. He is eating molre oatmeal and cheerios. He states has not started flaxseed or fish oil. .      Prior Medications Reviewed Using: Patient Recall  Updated Prior Medication List: DILTIAZEM HCL ER BEADS 420 MG  XR24H-CAP (DILTIAZEM HCL ER BEADS) One daily LISINOPRIL-HYDROCHLOROTHIAZIDE 20-25 MG  TABS (LISINOPRIL-HYDROCHLOROTHIAZIDE) One daily  Current Allergies (reviewed today): No known allergies   Past Medical History:    Reviewed history from 07/27/2008 and no changes required:       SEXUAL ACTIVITY, HIGH RISK (ICD-V69.2)       OBESITY NOS (ICD-278.00)       ALLERGIC RHINITIS, SEASONAL (ICD-477.0)       HYPERLIPIDEMIA (ICD-272.4)       HYPERTENSION (ICD-401.9)       GERD (ICD-530.81)         Past Surgical History:  Reviewed history from 02/14/2007 and no changes required:       Appendectomy-12/19/99       Tonsillectomy   Family History:    Reviewed history from 02/14/2007 and no changes required:       Father: 35 W&L       Mother: 44 HTN       Siblings: Sisters x 2: 42 and 44 W&L       KIds - none  Social History:    Reviewed history from 02/14/2007 and no changes required:       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  Resp      Denies cough, shortness of breath, sputum productive, and wheezing.  GI      Denies  abdominal pain, constipation, diarrhea, nausea, and vomiting.  GU      Denies nocturia, urinary frequency, and urinary hesitancy.   Physical Exam  General:     Well-developed,well-nourished,in no acute distress; alert,appropriate and cooperative throughout examination Lungs:     Normal respiratory effort, chest expands symmetrically. Lungs are clear to auscultation, no crackles or wheezes. Heart:     Normal rate and regular rhythm. S1 and S2 normal without gallop, murmur, click, rub or other extra sounds. Abdomen:     Bowel sounds positive,abdomen soft and non-tender without masses, organomegaly or hernias noted. Extremities:     No clubbing, cyanosis, edema, or deformity noted with normal full range of motion of all joints.   Cervical Nodes:     No lymphadenopathy noted Psych:     Cognition and judgment appear intact. Alert and cooperative with normal attention span and concentration. No apparent delusions, illusions, hallucinations    Impression & Recommendations:  Problem # 1:  HYPERTENSION (ICD-401.9) Stable. Rx as is. Limit salt and exersize daily. Encouraged dental care and educated inflammatory process poor dention can cause and how this potentially can play into heart disease. Aware and agrees. His updated medication list for this problem includes:    Diltiazem Hcl Er Beads 420 Mg Xr24h-cap (Diltiazem hcl er beads) ..... One daily    Lisinopril-hydrochlorothiazide 20-25 Mg Tabs (Lisinopril-hydrochlorothiazide) ..... One daily   Problem # 2:  HYPERLIPIDEMIA (ICD-272.4) Start Flaxseed. Recheck 3 months and if persist, start Niaspan. Agrees.  Problem # 3:  OBESITY NOS (ICD-278.00) Councelled diet, exersize and low fat intake. Exersize 3 to 5 times daily and limit calories to 1500 daily.  Problem # 4:  Preventive Health Care (ICD-V70.0) He is up to date for flu shot. Refuses H1N1 vaccine. Aware of risk and benefit.  Complete Medication List: 1)  Diltiazem Hcl Er Beads  420 Mg Xr24h-cap (Diltiazem hcl er beads) .... One daily 2)  Lisinopril-hydrochlorothiazide 20-25 Mg Tabs (Lisinopril-hydrochlorothiazide) .... One daily  Hypertension Assessment/Plan:      The patient's hypertensive risk group is category B: At least one risk factor (excluding diabetes) with no target organ damage.  His calculated 10 year risk of coronary heart disease is 6 %.  Today's blood pressure is 140/89.  His blood pressure goal is < 140/90.  Lipid Assessment/Plan:      Based on NCEP/ATP III, the patient's risk factor  category is "2 or more risk factors and a calculated 10 year CAD risk of < 20%".  From this information, the patient's calculated lipid goals are as follows: Total cholesterol goal is 200; LDL cholesterol goal is 130; HDL cholesterol goal is 40; Triglyceride goal is 150.     Patient Instructions: 1)  Please schedule a follow-up appointment in 3 months.   Prescriptions: LISINOPRIL-HYDROCHLOROTHIAZIDE 20-25 MG  TABS (LISINOPRIL-HYDROCHLOROTHIAZIDE) One daily  #30 Tablet x 3   Entered and Authorized by:   Franchot Heidelberg MD   Signed by:   Franchot Heidelberg MD on 12/14/2008   Method used:   Electronically to        [facility] (retail)       [address]       [place], [place]  [address]       Ph: [phone]       Fax: [phone]   RxID:   [id number] DILTIAZEM HCL ER BEADS 420 MG  XR24H-CAP (DILTIAZEM HCL ER BEADS) One daily  #30 x 3   Entered and Authorized by:   Franchot Heidelberg MD   Signed by:   Franchot Heidelberg MD on 12/14/2008   Method used:   Electronically to        [facility] (retail)       [address]       [place], [place]  [address]       Ph: [phone]       Fax: [phone]   RxID:   [id number]  ]  Preventive Care Screening  Last Flu Shot:    Next Due:  09/2009

## 2011-01-24 NOTE — Assessment & Plan Note (Signed)
Summary: 4 MONTH FOLLOW UP/ DMS   Vital Signs:  Patient Profile:   45 Years Old Male Height:     72 inches Weight:      296 pounds BMI:     40.29 O2 Sat:      99 % Temp:     97.9 degrees F Pulse rate:   82 / minute Resp:     16 per minute BP sitting:   135 / 81  Vitals Entered By: Sherilyn Banker (March 27, 2008 3:38 PM)                 PCP:  Franchot Heidelberg, MD  Chief Complaint:  follow up visit.  History of Present Illness: Pt in for recheck.  He notes he is doing well.  He states he has begun walking again and he states he [place]oit for the [place] [employer]. He has HTN and adds will help. He is not using salt. He walks daily and states does 3 miles a day. He denies chest pain, SOB, othopnea, PND and palpitaions. He is using meds daily. No side-effects. He states he can afford meds but adds Cardizem and Micardis are expensive. States samples would be help.He has not seen eye MD yet. States hs been long time sine he saw anyone. Previously saw Dr. Mayford Knife. Has not seen dentist either.  He now presents.  Lipid Management History:      Positive NCEP/ATP III risk factors include HDL cholesterol less than 40 and hypertension.  Negative NCEP/ATP III risk factors include male age less than 82 years old, non-diabetic, no family history for ischemic heart disease, non-tobacco-user status, no ASHD (atherosclerotic heart disease), no prior stroke/TIA, no peripheral vascular disease, and no history of aortic aneurysm.        The patient states that he knows about the "Therapeutic Lifestyle Change" diet.  His compliance with the TLC diet is fair.  The patient expresses understanding of adjunctive measures for cholesterol lowering.  Adjunctive measures started by the patient include aerobic exercise, fiber, ASA, and omega-3 supplements.       Current Allergies: No known allergies   Past Medical History:    Reviewed history from 02/14/2007 and no changes required:              SEXUAL  ACTIVITY, HIGH RISK (ICD-V69.2)       OBESITY NOS (ICD-278.00)       ALLERGIC RHINITIS, SEASONAL (ICD-477.0)       HYPERLIPIDEMIA (ICD-272.4)       HYPERTENSION (ICD-401.9)       GERD (ICD-530.81)         Past Surgical History:    Reviewed history from 02/14/2007 and no changes required:       Appendectomy-12/19/99       Tonsillectomy   Family History:    Reviewed history from 02/14/2007 and no changes required:       Father: 22 W&L       Mother: 67 HTN       Siblings: Sisters x 2: 27 and 53 W&L  Social History:    Reviewed history from 02/14/2007 and no changes required:       Occupation: [employer]        Single       Never Smoked       Alcohol use-no       Drug use-no   Risk Factors: Tobacco use:  never Drug use:  no Alcohol use:  no  Family  History Risk Factors:    Family History of MI in females < 91 years old:  no    Family History of MI in males < 55 years old:  no   Review of Systems      See HPI  General      Denies chills and sweats.  Resp      Denies chest discomfort, shortness of breath, sputum productive, and wheezing.  GI      Denies abdominal pain, constipation, diarrhea, nausea, and vomiting.  GU      Denies nocturia, urinary frequency, and urinary hesitancy.   Physical Exam  General:     Well-developed,well-nourished,in no acute distress; alert,appropriate and cooperative throughout examination Lungs:     Normal respiratory effort, chest expands symmetrically. Lungs are clear to auscultation, no crackles or wheezes. Heart:     Normal rate and regular rhythm. S1 and S2 normal without gallop, murmur, click, rub or other extra sounds. Abdomen:     Bowel sounds positive,abdomen soft and non-tender without masses, organomegaly or hernias noted. Extremities:     No clubbing, cyanosis, edema, or deformity noted with normal full range of motion of all joints.   Psych:     Cognition and judgment appear intact. Alert and cooperative with  normal attention span and concentration. No apparent delusions, illusions, hallucinations    Impression & Recommendations:  Problem # 1:  HYPERTENSION (ICD-401.9) Stable. Rx as below. TLC a must. Advised diet, exersize and low salt intake. Eye exam a must. His updated medication list for this problem includes:    Cardizem La 420 Mg Tb24 (Diltiazem hcl coated beads) ..... One by mouth daily    Micardis Hct 40-12.5 Mg Tabs (Telmisartan-hctz) ..... One by mouth daily  Orders: T-Basic Metabolic Panel [phone]) Venipuncture ([id number])   Problem # 2:  OBESITY NOS (ICD-278.00) Just started exersize. Advised cnt course and montior intake and portion control.  Problem # 3:  HYPERLIPIDEMIA (ICD-272.4) Repeat labs August. Agian lifestyle chnage a must.  Problem # 4:  Preventive Health Care (ICD-V70.0) Discussed. He will defer PSA and rectal; until physical in August.   Complete Medication List: 1)  Cardizem La 420 Mg Tb24 (Diltiazem hcl coated beads) .... One by mouth daily 2)  Micardis Hct 40-12.5 Mg Tabs (Telmisartan-hctz) .... One by mouth daily  Lipid Assessment/Plan:      Based on NCEP/ATP III, the patient's risk factor category is "2 or more risk factors and a calculated 10 year CAD risk of < 20%".  From this information, the patient's calculated lipid goals are as follows: Total cholesterol goal is 200; LDL cholesterol goal is 130; HDL cholesterol goal is 40; Triglyceride goal is 150.     Patient Instructions: 1)  Please schedule a follow-up appointment in 4 months.    ]

## 2011-01-24 NOTE — Assessment & Plan Note (Signed)
Summary: 3 WEEK FOLLOW UP/ARC   Vital Signs:  Patient Profile:   45 Years Old Male Height:     72 inches Weight:      285 pounds BMI:     38.79 O2 Sat:      98 % Temp:     97.4 degrees F Pulse rate:   79 / minute Resp:     14 per minute BP sitting:   136 / 81  Vitals Entered By: Sherilyn Banker (November 28, 2007 3:38 PM)                 PCP:  Franchot Heidelberg, MD  Chief Complaint:  follow up visit.  History of Present Illness: Pt in for recheck.  He notes he is doing very well. He was recently scared due to a girlfriend who told him she had syphillis. He had extensive work-up and results are reviewed - see result in EMR. He states he will use condoms every time from now on. States he confronted her with this and she fessed up she never had this.  He now presents.  Hypertension History:      He denies headache, chest pain, palpitations, dyspnea with exertion, orthopnea, PND, peripheral edema, visual symptoms, neurologic problems, syncope, and side effects from treatment.  He notes no problems with any antihypertensive medication side effects.  Further comments include: Taking BP meds daily. Watching salt intake. He avoids this as much as he can. He is not exersizing as he should.        Positive major cardiovascular risk factors include hyperlipidemia and hypertension.  Negative major cardiovascular risk factors include male age less than 16 years old, no history of diabetes, negative family history for ischemic heart disease, and non-tobacco-user status.        Further assessment for target organ damage reveals no history of ASHD, stroke/TIA, or peripheral vascular disease.    Lipid Management History:      Positive NCEP/ATP III risk factors include HDL cholesterol less than 40 and hypertension.  Negative NCEP/ATP III risk factors include male age less than 21 years old, non-diabetic, no family history for ischemic heart disease, non-tobacco-user status, no ASHD (atherosclerotic  heart disease), no prior stroke/TIA, no peripheral vascular disease, and no history of aortic aneurysm.        The patient states that he knows about the "Therapeutic Lifestyle Change" diet.  His compliance with the TLC diet is fair.  The patient expresses understanding of adjunctive measures for cholesterol lowering.  Adjunctive measures started by the patient include aerobic exercise, fiber, and omega-3 supplements.  Comments: Trying to eat healthier.    Current Allergies (reviewed today): No known allergies   Past Medical History:    Reviewed history from 02/14/2007 and no changes required:       GERD       Hypertension  Past Surgical History:    Reviewed history from 02/14/2007 and no changes required:       Appendectomy-12/19/99       Tonsillectomy   Family History:    Reviewed history from 02/14/2007 and no changes required:       Father: 77 W&L       Mother: 2 HTN       Siblings: Sisters x 2: 67 and 69 W&L  Social History:    Reviewed history from 02/14/2007 and no changes required:       Occupation: [employer]es        Single  Never Smoked       Alcohol use-no       Drug use-no    Review of Systems      See HPI  General      Denies fatigue and malaise.  CV      Denies bluish discoloration of lips or nails, swelling of feet, swelling of hands, and weight gain.  Resp      Denies chest discomfort, shortness of breath, sputum productive, and wheezing.  GI      Denies abdominal pain, constipation, diarrhea, nausea, and vomiting.  GU      Denies decreased libido, nocturia, urinary frequency, and urinary hesitancy.   Physical Exam  General:     Well-developed,well-nourished,in no acute distress; alert,appropriate and cooperative throughout examination Lungs:     Normal respiratory effort, chest expands symmetrically. Lungs are clear to auscultation, no crackles or wheezes. Heart:     Normal rate and regular rhythm. S1 and S2 normal without gallop,  murmur, click, rub or other extra sounds. Abdomen:     Bowel sounds positive,abdomen soft and non-tender without masses, organomegaly or hernias noted. Extremities:     No clubbing, cyanosis, edema, or deformity noted with normal full range of motion of all joints.   Psych:     Cognition and judgment appear intact. Alert and cooperative with normal attention span and concentration. No apparent delusions, illusions, hallucinations    Impression & Recommendations:  Problem # 1:  HYPERTENSION (ICD-401.9) Bp at goal per JNC 7. Advised diet, exersize and low salt intake with meds as is. Recheck 4 months and check renal function and lytes. His updated medication list for this problem includes:    Cardizem La 420 Mg Tb24 (Diltiazem hcl coated beads) ..... One by mouth daily    Micardis Hct 40-12.5 Mg Tabs (Telmisartan-hctz) ..... One by mouth daily   Problem # 2:  HYPERLIPIDEMIA (ICD-272.4) TLC only. Advised diet, exersize and low fat intake.  Problem # 3:  SEXUAL ACTIVITY, HIGH RISK (ICD-V69.2) Reassured about labs. Advised condom use and safe sex at all times. Agrees.  Problem # 4:  Preventive Health Care (ICD-V70.0) PSA and rectal next visit. Agrees.  Complete Medication List: 1)  Cardizem La 420 Mg Tb24 (Diltiazem hcl coated beads) .... One by mouth daily 2)  Micardis Hct 40-12.5 Mg Tabs (Telmisartan-hctz) .... One by mouth daily  Hypertension Assessment/Plan:      The patient's hypertensive risk group is category B: At least one risk factor (excluding diabetes) with no target organ damage.  His calculated 10 year risk of coronary heart disease is 9 %.  Today's blood pressure is 136/81.  His blood pressure goal is < 140/90.  Lipid Assessment/Plan:      Based on NCEP/ATP III, the patient's risk factor category is "2 or more risk factors and a calculated 10 year CAD risk of < 20%".  From this information, the patient's calculated lipid goals are as follows: Total cholesterol goal is  200; LDL cholesterol goal is 130; HDL cholesterol goal is 40; Triglyceride goal is 150.     Patient Instructions: 1)  Please schedule a follow-up appointment in 4 months.    ]

## 2011-01-24 NOTE — Progress Notes (Signed)
Summary: refill request  Phone Note Call from Patient   Caller: Patient Call For: kim Summary of Call: patient called and said the pharmacy has not received the refill request for cardizem advised mr. heindel that it was faxed on 03-18-07 patient is going to call the pharmacy to see if they have over looked it and if not he'll call back advised patient will refax  Initial call taken by: Alden Server,  March 20, 2007 1:20 PM

## 2011-01-24 NOTE — Assessment & Plan Note (Signed)
Summary: FOLLOW UP/ARC   Vital Signs:  Patient profile:   45 year old male Height:      72 inches Weight:      286 pounds BMI:     38.93 O2 Sat:      96 % Temp:     98.0 degrees F Pulse rate:   77 / minute Resp:     14 per minute BP sitting:   132 / 79  Vitals Entered By: Sherilyn Banker LPN (July 14, 2009 3:31 PM)  Nutrition Counseling: Patient's BMI is greater than 25 and therefore counseled on weight management options. CC: BP recheck, Lipid Management Nutritional Status BMI of > 30 = obese   Primary Provider:  Franchot Heidelberg, MD  CC:  BP recheck and Lipid Management.  History of Present Illness: Pt in for recheck.  He notes he is doing well.   He states he is doing well with his medications. He has HTN and denies side-effects on Rx. He is using daily. He is watching salt. He does not exersize as he should and states heat has made him cut back. Tried to go early am or late night and was too hot. He is watching diet some - weight down 2 pounds. He denies chest pain, SOB, orthopnea, PND and palpitations and leg swelling. He saw dentist quite a while back and notes sees eye MD yearly.  He now presents.  Lipid Management History:      Positive NCEP/ATP III risk factors include HDL cholesterol less than 40 and hypertension.  Negative NCEP/ATP III risk factors include male age less than 56 years old, non-diabetic, no family history for ischemic heart disease, non-tobacco-user status, no ASHD (atherosclerotic heart disease), no prior stroke/TIA, no peripheral vascular disease, and no history of aortic aneurysm.        The patient states that he knows about the "Therapeutic Lifestyle Change" diet.  His compliance with the TLC diet is fair.  Adjunctive measures started by the patient include aerobic exercise and fiber.  He expresses no side effects from his lipid-lowering medication.  The patient denies any symptoms to suggest myopathy or liver disease.  Comments: Denies side-effects on  Niaspan. Admits loves fried foods - chicken. .   Current Problems (verified): 1)  Obesity Nos  (ICD-278.00) 2)  Allergic Rhinitis, Seasonal  (ICD-477.0) 3)  Hyperlipidemia  (ICD-272.4) 4)  Hypertension  (ICD-401.9) 5)  Gerd  (ICD-530.81)  Current Medications (verified): 1)  Diltiazem Hcl Er Beads 420 Mg  Xr24h-Cap (Diltiazem Hcl Er Beads) .... One Daily 2)  Lisinopril-Hydrochlorothiazide 20-25 Mg  Tabs (Lisinopril-Hydrochlorothiazide) .... One Daily 3)  Niaspan 500 Mg Cr-Tabs (Niacin (Antihyperlipidemic)) .... Once Daily 4)  Adult Aspirin Ec Low Strength 81 Mg Tbec (Aspirin) .... 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 Resp:  Denies cough, shortness of breath, sputum productive, and wheezing. GI:  Denies abdominal pain, constipation, diarrhea, nausea, and vomiting. GU:  Denies nocturia, urinary frequency, and urinary hesitancy. Psych:  Denies anxiety and depression.  Physical Exam  General:  Well-developed,well-nourished,in no acute distress; alert,appropriate and cooperative throughout examination Lungs:  Normal respiratory effort,  chest expands symmetrically. Lungs are clear to auscultation, no crackles or wheezes. Heart:  Normal rate and regular rhythm. S1 and S2 normal without gallop, murmur, click, rub or other extra sounds. Abdomen:  Bowel sounds positive,abdomen soft and non-tender without masses, organomegaly or hernias noted. Extremities:  No clubbing, cyanosis, edema, or deformity noted with normal full range of motion of all joints.  Well healed scars right hand. Cervical Nodes:  No lymphadenopathy noted Psych:  Cognition and judgment appear intact. Alert and cooperative with normal attention span and concentration. No apparent delusions, illusions, hallucinations   Impression & Recommendations:  Problem # 1:  HYPERTENSION (ICD-401.9) Stable. Rx as is. Labs as below and physcial in 4 weeks. Limit salt. Advised eye and dental care. His updated medication list for this problem includes:    Diltiazem Hcl Er Beads 420 Mg Xr24h-cap (Diltiazem hcl er beads) ..... One daily    Lisinopril-hydrochlorothiazide 20-25 Mg Tabs (Lisinopril-hydrochlorothiazide) ..... One daily  Orders: T-Comprehensive Metabolic Panel [id number]) T-CBC w/Diff ([id number])  Problem # 2:  HYPERLIPIDEMIA (ICD-272.4) Recheck lipids and optomize. Urged TLC diet, weight loss and avoidance of fatty foods. His updated medication list for this problem includes:    Niaspan 500 Mg Cr-tabs (Niacin (antihyperlipidemic)) ..... Once daily  Orders: T-Comprehensive Metabolic Panel [id number]) T-Lipid Profile [id number]) T-TSH [phone])  Problem # 3:  OBESITY NOS (ICD-278.00) Discussed. See above. Advised mall walking with air con.   Problem # 4:  Preventive Health Care (ICD-V70.0) Check PSA. Advised risk and benefit. Councelled need for rectak and agreeable. Appt for male physical in 4 weeks.  Complete Medication List: 1)  Diltiazem Hcl Er Beads 420 Mg Xr24h-cap (Diltiazem hcl er beads) .... One daily 2)   Lisinopril-hydrochlorothiazide 20-25 Mg Tabs (Lisinopril-hydrochlorothiazide) .... One daily 3)  Niaspan 500 Mg Cr-tabs (Niacin (antihyperlipidemic)) .... Once daily 4)  Adult Aspirin Ec Low Strength 81 Mg Tbec (Aspirin) .... Once daily  Other Orders: T-PSA ([id number])  Lipid Assessment/Plan:      Based on NCEP/ATP III, the patient's risk factor category is "2 or more risk factors and a calculated 10 year CAD risk of < 20%".  The patient's lipid goals are as follows: Total cholesterol goal is 200; LDL cholesterol goal is 130; HDL cholesterol goal is 40; Triglyceride goal is 150.    Patient Instructions: 1)  Please schedule a follow-up appointment in 1 month.

## 2011-01-24 NOTE — Assessment & Plan Note (Signed)
Summary: 3 month follow up/arc   Vital Signs:  Patient Profile:   45 Years Old [place] Height:     72 inches Weight:      277 pounds BMI:     37.70 Pulse rate:   87 / minute Resp:     16 per minute BP sitting:   130 / 80  (left arm)  Pt. in pain?   no  Vitals Entered BySonny Dandy (August 09, 2007 3:17 PM)                PCP:  Franchot Heidelberg, MD  Chief Complaint:  3 mont follow up.  History of Present Illness: Pt in for recheck.  He states he is doing well. He hates coming to the doctor.   He has HTN. He is taking meds daily. He has not had chest pain, shortness of breath. No palpatations. No leg swelling. He is watching salt intakeand has begun walking 2.5 miles a day. He is trying to eat healthy - hard to do. Needs refills on medications.  He has not been coughing or wheezing. He is a non-smoker.  Apetite is "too good". He denies nausea and vomitting and notes no diarrhea or constipation. State he eats cereal  and has loose stools. Wonder if it is cereal or milk.  He denies urinary sx. No burning, stinging or bad smells.  Now presents.  Hypertension History:      Positive major cardiovascular risk factors include hyperlipidemia and hypertension.  Negative major cardiovascular risk factors include [place] age less than 81 years old, no history of diabetes, negative family history for ischemic heart disease, and non-tobacco-user status.        Further assessment for target organ damage reveals no history of ASHD, stroke/TIA, or peripheral vascular disease.    Lipid Management History:      Positive NCEP/ATP III risk factors include HDL cholesterol less than 40 and hypertension.  Negative NCEP/ATP III risk factors include [place] age less than 21 years old, non-diabetic, no family history for ischemic heart disease, non-tobacco-user status, no ASHD (atherosclerotic heart disease), no prior stroke/TIA, no peripheral vascular disease, and no history of aortic aneurysm.    The patient states that he knows about the "Therapeutic Lifestyle Change" diet.  His compliance with the TLC diet is fair.  The patient expresses understanding of adjunctive measures for cholesterol lowering.  Adjunctive measures started by the patient include aerobic exercise, fiber, and omega-3 supplements.  Comments: Has lost ten pounds since last visit due to exersize.    Current Allergies (reviewed today): No known allergies   Past Medical History:    Reviewed history from 02/14/2007 and no changes required:       GERD       Hypertension  Past Surgical History:    Reviewed history from 02/14/2007 and no changes required:       Appendectomy-12/19/99       Tonsillectomy   Family History:    Reviewed history from 02/14/2007 and no changes required:       Father: 87 W&L       Mother: 19 HTN       Siblings: Sisters x 2: 42 and 46 W&L  Social History:    Reviewed history from 02/14/2007 and no changes required:       Occupation: [employer]es        Single       Never Smoked       Alcohol use-no  Drug use-no    Review of Systems      See HPI   Physical Exam  General:     Well-developed,well-nourished,in no acute distress; alert,appropriate and cooperative throughout examination Lungs:     Normal respiratory effort, chest expands symmetrically. Lungs are clear to auscultation, no crackles or wheezes. Heart:     Normal rate and regular rhythm. S1 and S2 normal without gallop, murmur, click, rub or other extra sounds. Abdomen:     Bowel sounds positive,abdomen soft and non-tender without masses, organomegaly or hernias noted. Extremities:     No clubbing, cyanosis, edema, or deformity noted with normal full range of motion of all joints.      Impression & Recommendations:  Problem # 1:  HYPERTENSION (ICD-401.9) BP at goal. Meds as is. Cont diet, exersize and weight loss. Advised eye exam. Check labs and UA. His updated medication list for this problem  includes:    Cardizem La 420 Mg Tb24 (Diltiazem hcl coated beads) ..... One by mouth daily    Micardis Hct 40-12.5 Mg Tabs (Telmisartan-hctz) ..... One by mouth daily  Orders: T-Comprehensive Metabolic Panel [id number]) T-Urinalysis ([id number])   Problem # 2:  HYPERLIPIDEMIA (ICD-272.4) Repeat and optomize per ATP III. TLC advised. Some loose stools with fiber rishc cereals. Advised trial soy milk to rule out Lactose intolerance. Orders: T-Comprehensive Metabolic Panel [id number]) T-Lipid Profile ([id number])   Problem # 3:  GERD (ICD-530.81) TLC only.  Problem # 4:  Preventive Health Care (ICD-V70.0) PSA and rectal next visit as he will turn 41. Agreeable but not excited about this.  Complete Medication List: 1)  Cardizem La 420 Mg Tb24 (Diltiazem hcl coated beads) .... One by mouth daily 2)  Micardis Hct 40-12.5 Mg Tabs (Telmisartan-hctz) .... One by mouth daily  Hypertension Assessment/Plan:      The patient's hypertensive risk group is category B: At least one risk factor (excluding diabetes) with no target organ damage.  His calculated 10 year risk of coronary heart disease is 4 %.  Today's blood pressure is 130/80.  His blood pressure goal is < 140/90.  Lipid Assessment/Plan:      Based on NCEP/ATP III, the patient's risk factor category is "2 or more risk factors and a calculated 10 year CAD risk of < 20%".  From this information, the patient's calculated lipid goals are as follows: Total cholesterol goal is 200; LDL cholesterol goal is 130; HDL cholesterol goal is 40; Triglyceride goal is 150.     Patient Instructions: 1)  Please schedule a follow-up appointment in 3 months.    Prescriptions: MICARDIS HCT 40-12.5 MG TABS (TELMISARTAN-HCTZ) One by mouth daily  #30 x 4   Entered and Authorized by:   Franchot Heidelberg MD   Signed by:   Franchot Heidelberg MD on 08/09/2007   Method used:   Print then Give to Patient   RxID:   [id number] CARDIZEM LA 420 MG  TB24 (DILTIAZEM HCL COATED BEADS) One by mouth daily  #30 x 4   Entered and Authorized by:   Franchot Heidelberg MD   Signed by:   Franchot Heidelberg MD on 08/09/2007   Method used:   Print then Give to Patient   RxID:   [id number]

## 2011-01-24 NOTE — Assessment & Plan Note (Signed)
Summary: FOLLOW UP 6 WEEK/SLJ   Vital Signs:  Patient profile:   45 year old male Height:      72 inches Weight:      291 pounds O2 Sat:      99 % Temp:     97.8 degrees F Pulse rate:   70 / minute Resp:     12 per minute BP sitting:   129 / 78  Vitals Entered By: Sherilyn Banker LPN (March 15, 2009 4:06 PM) CC: Recheck., Lipid Management Is Patient Diabetic? No   Primary Lorene Samaan:  Franchot Heidelberg, MD  CC:  Recheck..  History of Present Illness: Pt in for recheck.  His hand lac has healed. He denies parasthesia and he has good ROM. Denies weakness and states happy with wounds and knows will scar.   He needs recheck on HTN and Hyperlipidemia.  He has seasonal allergies and gets this each Sprin. Has sniffles, sneezing and gets watery scratchy eyes. He states has symptoms for several weeks on and off through Spring and Fall. Has used prescription Rx in past but cannot recall name.  He has not used nasal spray yet. He will try antyhing and would love samples.  He now presents.  Hypertension History:      He denies headache, chest pain, palpitations, dyspnea with exertion, orthopnea, PND, peripheral edema, visual symptoms, neurologic problems, syncope, and side effects from treatment.  He notes no problems with any antihypertensive medication side effects.  Further comments include: He is watching salt. He is walking for exersize and does to an hour daily - 5 to 7days a week. He uses Rx daily. Marland Kitchen        Positive major cardiovascular risk factors include hyperlipidemia and hypertension.  Negative major cardiovascular risk factors include male age less than 87 years old, no history of diabetes, negative family history for ischemic heart disease, and non-tobacco-user status.        Further assessment for target organ damage reveals no history of ASHD, stroke/TIA, or peripheral vascular disease.    Lipid Management History:      Positive NCEP/ATP III risk factors include HDL  cholesterol less than 40 and hypertension.  Negative NCEP/ATP III risk factors include male age less than 13 years old, non-diabetic, no family history for ischemic heart disease, non-tobacco-user status, no ASHD (atherosclerotic heart disease), no prior stroke/TIA, no peripheral vascular disease, and no history of aortic aneurysm.        The patient states that he does not know about the "Therapeutic Lifestyle Change" diet.  His compliance with the TLC diet is fair.  The patient expresses understanding of adjunctive measures for cholesterol lowering.  Adjunctive measures started by the patient include aerobic exercise and fiber.  Comments: He has watched diet. Agreeable with recheck on lipids.    Preventive Screening-Counseling & Management     Alcohol drinks/day: 0     Smoking Status: never  Problems Prior to Update: 1)  Thumb Pain, Right  (ICD-729.5) 2)  Cellulitis, Hand, Right  (ICD-682.4) 3)  Laceration, Hand, Right  (ICD-882.0) 4)  Special Screening Malignant Neoplasm of Prostate  (ICD-V76.44) 5)  Obesity Nos  (ICD-278.00) 6)  Allergic Rhinitis, Seasonal  (ICD-477.0) 7)  Hyperlipidemia  (ICD-272.4) 8)  Hypertension  (ICD-401.9) 9)  Gerd  (ICD-530.81)  Current Problems (verified): 1)  Obesity Nos  (ICD-278.00) 2)  Allergic Rhinitis, Seasonal  (ICD-477.0) 3)  Hyperlipidemia  (ICD-272.4) 4)  Hypertension  (ICD-401.9) 5)  Gerd  (ICD-530.81)  Medications  Prior to Update: 1)  Diltiazem Hcl Er Beads 420 Mg  Xr24h-Cap (Diltiazem Hcl Er Beads) .... One Daily 2)  Lisinopril-Hydrochlorothiazide 20-25 Mg  Tabs (Lisinopril-Hydrochlorothiazide) .... One Daily 3)  Doxycycline Hyclate 100 Mg Tabs (Doxycycline Hyclate) .... Two Times A Day  Current Medications (verified): 1)  Diltiazem Hcl Er Beads 420 Mg  Xr24h-Cap (Diltiazem Hcl Er Beads) .... One Daily 2)  Lisinopril-Hydrochlorothiazide 20-25 Mg  Tabs (Lisinopril-Hydrochlorothiazide) .... 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 Resp:  Denies cough, shortness of breath, sputum productive, and wheezing. GI:  Denies abdominal pain, constipation, diarrhea, nausea, and vomiting. GU:  Denies nocturia, urinary frequency, and urinary hesitancy.  Physical Exam  General:  Well-developed,well-nourished,in no acute distress; alert,appropriate and cooperative throughout examination Lungs:  Normal respiratory effort, chest expands symmetrically. Lungs are clear to auscultation, no crackles or wheezes. Heart:  Normal rate and regular rhythm. S1 and S2 normal without gallop, murmur, click, rub or other extra sounds. Abdomen:  Bowel sounds positive,abdomen soft and non-tender without masses, organomegaly or hernias noted. Extremities:  No clubbing, cyanosis, edema, or deformity noted with normal full range of motion of all joints.  Well healed scars right hand. Cervical Nodes:  No lymphadenopathy noted Psych:  Cognition and judgment appear intact. Alert and cooperative with normal attention span and concentration. No apparent delusions, illusions, hallucinations   Impression & Recommendations:  Problem # 1:  HYPERTENSION (ICD-401.9) Excellent control. Rx as is. TLC diet and exersise a must. Advied eye and denatl care. Agreeable. His updated medication list for this problem includes:    Diltiazem Hcl Er Beads 420 Mg Xr24h-cap (Diltiazem hcl er beads) ..... One daily    Lisinopril-hydrochlorothiazide 20-25 Mg Tabs  (Lisinopril-hydrochlorothiazide) ..... One daily  Problem # 2:  HYPERLIPIDEMIA (ICD-272.4) Repeat lab and optomize per ATP III. Again, encouraged fish oil, oats, flaxseed and low fat diet. Orders: T-Comprehensive Metabolic Panel [id number]) T-Lipid Profile ([id number])  Problem # 3:  OBESITY NOS (ICD-278.00) See above.  Orders: T-Comprehensive Metabolic Panel ([id number])  Problem # 4:  ALLERGIC RHINITIS, SEASONAL (ICD-477.0) Trial Nasocort Aqua with two samples given. Advised need for home airfilter chnages and allergen avoidance. Update in 2 weeks and if symptoms not better, will need oral Rx and possible cortisone injection. Agreeable.  Complete Medication List: 1)  Diltiazem Hcl Er Beads 420 Mg Xr24h-cap (Diltiazem hcl er beads) .... One daily 2)  Lisinopril-hydrochlorothiazide 20-25 Mg Tabs (Lisinopril-hydrochlorothiazide) .... One daily  Hypertension Assessment/Plan:      The patient's hypertensive risk group is category B: At least one risk factor (excluding diabetes) with no target organ damage.  His calculated 10 year risk of coronary heart disease is 4 %.  Today's blood pressure is  129/78.  His blood pressure goal is < 140/90.  Lipid Assessment/Plan:      Based on NCEP/ATP III, the patient's risk factor category is "2 or more risk factors and a calculated 10 year CAD risk of < 20%".  From this information, the patient's calculated lipid goals are as follows: Total cholesterol goal is 200; LDL cholesterol goal is 130; HDL cholesterol goal is 40; Triglyceride goal is 150.    Patient Instructions: 1)  Please schedule a follow-up appointment in 3 months.

## 2011-01-24 NOTE — Assessment & Plan Note (Signed)
Summary: 6 WEEK FOLLOW UP/ARC   Vital Signs:  Patient Profile:   45 Years Old Male Height:     72 inches Weight:      290 pounds BMI:     39.47 O2 Sat:      95 % Temp:     98 degrees F Pulse rate:   74 / minute Resp:     14 per minute BP sitting:   130 / 75  Vitals Entered By: Sherilyn Banker (September 21, 2008 3:30 PM)                 PCP:  Franchot Heidelberg, MD  Chief Complaint:  follow up visit.  History of Present Illness: Pt in for recheck.  He had labs done and this is reviewed from august:  1. CBC - normal 2. CMP - BS 104. He denies polyuria, polydispia and polyhagia.  He is walking daily and does an hour a day even with rain. He is watching diet but adds can do a lot better. Weight down 4 pounds since last eval and happy with this.  3. Lipids -  see below. 4. TSH - normal 5. PSA - normal  - denies burning, stinging and bad smells. Does not pee at night. No family hx of prostate cancer.  He states has eye exam set for September 24, 2008 with Dr. Charise Killian.  He has not seen dentist yet.  He is requesting flu-shot today.  He now presents.  Hypertension History:      He denies headache, chest pain, palpitations, dyspnea with exertion, orthopnea, PND, peripheral edema, visual symptoms, neurologic problems, syncope, and side effects from treatment.  He notes no problems with any antihypertensive medication side effects.        Positive major cardiovascular risk factors include hyperlipidemia and hypertension.  Negative major cardiovascular risk factors include male age less than 68 years old, no history of diabetes, negative family history for ischemic heart disease, and non-tobacco-user status.        Further assessment for target organ damage reveals no history of ASHD, stroke/TIA, or peripheral vascular disease.    Lipid Management History:      Positive NCEP/ATP III risk factors include HDL cholesterol less than 40 and hypertension.  Negative NCEP/ATP III risk factors  include male age less than 48 years old, non-diabetic, no family history for ischemic heart disease, non-tobacco-user status, no ASHD (atherosclerotic heart disease), no prior stroke/TIA, no peripheral vascular disease, and no history of aortic aneurysm.        The patient states that he knows about the "Therapeutic Lifestyle Change" diet.  His compliance with the TLC diet is fair.  The patient expresses understanding of adjunctive measures for cholesterol lowering.  Adjunctive measures started by the patient include aerobic exercise, fiber, and omega-3 supplements.  Comments: STates not open to Rx. Will try fish oil. .      Prior Medications Reviewed Using: Patient Recall  Updated Prior Medication List: DILTIAZEM HCL ER BEADS 420 MG  XR24H-CAP (DILTIAZEM HCL ER BEADS) One daily LISINOPRIL-HYDROCHLOROTHIAZIDE 20-25 MG  TABS (LISINOPRIL-HYDROCHLOROTHIAZIDE) One daily  Current Allergies: No known allergies      Review of Systems      See HPI  General      Denies chills, fever, and sweats.  Resp      Denies cough, shortness of breath, sputum productive, and wheezing.  GI      Denies abdominal pain, constipation, diarrhea, nausea, and vomiting.  GU  Denies nocturia, urinary frequency, and urinary hesitancy.   Physical Exam  General:     Well-developed,well-nourished,in no acute distress; alert,appropriate and cooperative throughout examination Lungs:     Normal respiratory effort, chest expands symmetrically. Lungs are clear to auscultation, no crackles or wheezes. Heart:     Normal rate and regular rhythm. S1 and S2 normal without gallop, murmur, click, rub or other extra sounds. Abdomen:     Bowel sounds positive,abdomen soft and non-tender without masses, organomegaly or hernias noted. Rectal:     No external abnormalities noted. Normal sphincter tone. No rectal masses or tenderness. Prostate:     Prostate gland firm and smooth, no enlargement, nodularity,  tenderness, mass, asymmetry or induration. Extremities:     No clubbing, cyanosis, edema, or deformity noted with normal full range of motion of all joints.   Cervical Nodes:     No lymphadenopathy noted Psych:     Cognition and judgment appear intact. Alert and cooperative with normal attention span and concentration. No apparent delusions, illusions, hallucinations    Impression & Recommendations:  Problem # 1:  HYPERTENSION (ICD-401.9) Discussed. BP to goal. Rx as is. TLC a must. Limit salt. Get eye exam as set. Dnetal care encouraged. His updated medication list for this problem includes:    Diltiazem Hcl Er Beads 420 Mg Xr24h-cap (Diltiazem hcl er beads) ..... One daily    Lisinopril-hydrochlorothiazide 20-25 Mg Tabs (Lisinopril-hydrochlorothiazide) ..... One daily   Problem # 2:  HYPERLIPIDEMIA (ICD-272.4) Not open to script. Councelled diet, exersize and weight loss with fat intake reduction. Start fish oil OTC 1000 mg two times a day and recheck 3 months. if not to goal, add Niaspan. Agrees.  Problem # 3:  OBESITY NOS (ICD-278.00) Happy with weight loss. Councelled portin control, healthy foods and daily exersize as is. Councelled risk for DM with increased fasting BS and educated how weight reduction will play in to reducing DM risk. Agrees.  Problem # 4:  SPECIAL SCREENING MALIGNANT NEOPLASM OF PROSTATE (ICD-V76.44) Normal rectal and PSA. Recheck one year.  Problem # 5:  Preventive Health Care (ICD-V70.0) Update flu shot. Advised risk and benefit. Gave IS. Update if side-effects.  Complete Medication List: 1)  Diltiazem Hcl Er Beads 420 Mg Xr24h-cap (Diltiazem hcl er beads) .... One daily 2)  Lisinopril-hydrochlorothiazide 20-25 Mg Tabs (Lisinopril-hydrochlorothiazide) .... One daily  Other Orders: Influenza Vaccine NON MCR ([id number])  Hypertension Assessment/Plan:      The patient's hypertensive risk group is category B: At least one risk factor (excluding diabetes)  with no target organ damage.  His calculated 10 year risk of coronary heart disease is 4 %.  Today's blood pressure is 130/75.  His blood pressure goal is < 140/90.  Lipid Assessment/Plan:      Based on NCEP/ATP III, the patient's risk factor category is "2 or more risk factors and a calculated 10 year CAD risk of < 20%".  From this information, the patient's calculated lipid goals are as follows: Total cholesterol goal is 200; LDL cholesterol goal is 130; HDL cholesterol goal is 40; Triglyceride goal is 150.     Patient Instructions: 1)  Please schedule a follow-up appointment in 3 months.   ]  Influenza Vaccine    Vaccine Type: Fluvax Non-MCR    Site: left deltoid    Mfr: GlaxoSmithKline    Dose: 0.5 ml    Route: IM    Given by: Sherilyn Banker    Exp. Date: 06/23/2009    Lot #:  [id number]    VIS given: 07/18/07 version given September 21, 2008.  Flu Vaccine Consent Questions    Do you have a history of severe allergic reactions to this vaccine? no    Any prior history of allergic reactions to egg and/or gelatin? no    Do you have a sensitivity to the preservative Thimersol? no    Do you have a past history of Guillan-Barre Syndrome? no    Do you currently have an acute febrile illness? no    Have you ever had a severe reaction to latex? no    Vaccine information given and explained to patient? yes

## 2011-01-24 NOTE — Letter (Signed)
Summary: Historic rma chart  Historic rma chart   Imported By: Curtis Sites 10/19/2009 10:00:49  _____________________________________________________________________  External Attachment:    Type:   Image     Comment:   External Document

## 2011-01-24 NOTE — Assessment & Plan Note (Signed)
Summary: follow up/slj   Vital Signs:  Patient profile:   45 year old male Height:      72 inches Weight:      285 pounds BMI:     38.79 O2 Sat:      98 % Temp:     97.6 degrees F Pulse rate:   75 / minute Resp:     16 per minute BP sitting:   134 / 77  Vitals Entered By: Sherilyn Banker LPN (August 20, 2009 3:43 PM)  Nutrition Counseling: Patient's BMI is greater than 25 and therefore counseled on weight management options.  Primary Provider:  Franchot Heidelberg, MD  CC:  Recheck.  History of Present Illness: He is in for recheck.  He notes he is doig well. He had labs done and this is reviewed:  1. CBC - HGB normal at 13 with MCV at 76.1. He denies bruising or bleeding. He denies use of NSAIDs. He notes no malaise or fatigue.  2. CMP - BS 106. He denies symptoms of hyperglycemia and notes knows needs to work on diet and exersize. He has a fam hx of DM in [place] on mothe''s side. Aunts on mom's side had it too.  he is trying to eat fruits and veggies. He has not exersized as he should and hopes coller weathet will help. 3. Lipids - see below. 4. PSA - normal - denies hesitancy, nocturia and fmaily hx of prostae cancer. Not agreeable with rectal today. "Don't feel like it and will have new doctor do". 5. TSH - normal  He now presents.  Hypertension History:      He denies headache, chest pain, palpitations, dyspnea with exertion, orthopnea, PND, peripheral edema, visual symptoms, neurologic problems, syncope, and side effects from treatment.  He notes no problems with any antihypertensive medication side effects.  Further comments include: He is watching salt. Takes Rx daily.        Positive major cardiovascular risk factors include hyperlipidemia and hypertension.  Negative major cardiovascular risk factors include male age less than 6 years old, no history of diabetes, negative family history for ischemic heart disease, and non-tobacco-user status.        Further assessment for  target organ damage reveals no history of ASHD, stroke/TIA, or peripheral vascular disease.    Lipid Management History:      Positive NCEP/ATP III risk factors include HDL cholesterol less than 40 and hypertension.  Negative NCEP/ATP III risk factors include male age less than 30 years old, non-diabetic, no family history for ischemic heart disease, non-tobacco-user status, no ASHD (atherosclerotic heart disease), no prior stroke/TIA, no peripheral vascular disease, and no history of aortic aneurysm.        The patient states that he knows about the "Therapeutic Lifestyle Change" diet.  His compliance with the TLC diet is fair.  The patient expresses understanding of adjunctive measures for cholesterol lowering.  He expresses no side effects from his lipid-lowering medication.  The patient denies any symptoms to suggest myopathy or liver disease.  Comments: He admits he slef stopped Niaspan. No side-effects and just stopped it. Promises to resume.Adds not expensive either - $10.    Current Problems (verified): 1)  Special Screening Malignant Neoplasm of Prostate  (ICD-V76.44) 2)  Obesity Nos  (ICD-278.00) 3)  Allergic Rhinitis, Seasonal  (ICD-477.0) 4)  Hyperlipidemia  (ICD-272.4) 5)  Hypertension  (ICD-401.9) 6)  Gerd  (ICD-530.81)  Current Medications (verified): 1)  Diltiazem Hcl Er Beads 420 Mg  Xr24h-Cap (Diltiazem Hcl Er Beads) .... One Daily 2)  Lisinopril-Hydrochlorothiazide 20-25 Mg  Tabs (Lisinopril-Hydrochlorothiazide) .... One Daily 3)  Niaspan 500 Mg Cr-Tabs (Niacin (Antihyperlipidemic)) .... Once Daily 4)  Adult Aspirin Ec Low Strength 81 Mg Tbec (Aspirin) .... Once Daily  Allergies (verified): No Known Drug Allergies  Review of Systems      See HPI General:  Denies chills, fever, and sweats. Resp:  Denies cough, shortness of breath, sputum productive, and wheezing. GI:  Denies abdominal pain, constipation, diarrhea, nausea, and vomiting. GU:  See HPI; Denies nocturia,  urinary frequency, and urinary hesitancy. Psych:  Denies anxiety and depression.  Physical Exam  General:  Well-developed,well-nourished,in no acute distress; alert,appropriate and cooperative throughout examination Lungs:  Normal respiratory effort, chest expands symmetrically. Lungs are clear to auscultation, no crackles or wheezes. Heart:  Normal rate and regular rhythm. S1 and S2 normal without gallop, murmur, click, rub or other extra sounds. Abdomen:  Bowel sounds positive,abdomen soft and non-tender without masses, organomegaly or hernias noted. Extremities:  No clubbing, cyanosis, edema, or deformity noted with normal full range of motion of all joints.  Cervical Nodes:  No lymphadenopathy noted Psych:  Cognition and judgment appear intact. Alert and cooperative with normal attention span and concentration. No apparent delusions, illusions, hallucinations   Impression & Recommendations:  Problem # 1:  HYPERTENSION (ICD-401.9) JNC 7 goal met. Cont RX, TLC diet and exersize. Urged weight loss and councelled how this can help. Aware of need for eye and dental care. His updated medication list for this problem includes:    Diltiazem Hcl Er Beads 420 Mg Xr24h-cap (Diltiazem hcl er beads) ..... One daily    Lisinopril-hydrochlorothiazide 20-25 Mg Tabs (Lisinopril-hydrochlorothiazide) ..... One daily  Problem # 2:  HYPERLIPIDEMIA (ICD-272.4) Resume Rx as self stopped. Councelled ATP III goal. TLC diet and exersize urged as well as conservative measures with high fiber diet, oatmeal etc. His updated medication list for this problem includes:    Niaspan 500 Mg Cr-tabs (Niacin (antihyperlipidemic)) ..... Once daily  Problem # 3:  OBESITY NOS (ICD-278.00) See above. Aware of health risk. Aim for one ound of weight loss per week. Walk daily for 30 minutes, limit portion size.  Problem # 4:  SPECIAL SCREENING MALIGNANT NEOPLASM OF PROSTATE (ICD-V76.44) Refuses rectal. Defer to new PCP per  his request. Advised non specificty of PSA and why rectal needed. Aware.  Problem # 5:  HYPERGLYCEMIA, FASTING (ICD-790.29) See above. Track BS periodically given fam hx of DM and consider A1C in 3 months.  Problem # 6:  Preventive Health Care (ICD-V70.0) Advised flu shot and will get at work next month.   Problem # 7:  Microcytosis No anemia. New finding.? lab variation. Recheck CBC 3 months and if persist, do iron studies etc.  Complete Medication List: 1)  Diltiazem Hcl Er Beads 420 Mg Xr24h-cap (Diltiazem hcl er beads) .... One daily 2)  Lisinopril-hydrochlorothiazide 20-25 Mg Tabs (Lisinopril-hydrochlorothiazide) .... One daily 3)  Niaspan 500 Mg Cr-tabs (Niacin (antihyperlipidemic)) .... Once daily 4)  Adult Aspirin Ec Low Strength 81 Mg Tbec (Aspirin) .... Once daily  Hypertension Assessment/Plan:      The patient's hypertensive risk group is category B: At least one risk factor (excluding diabetes) with no target organ damage.  His calculated 10 year risk of coronary heart disease is 9 %.  Today's blood pressure is 134/77.  His blood pressure goal is < 140/90.  Lipid Assessment/Plan:      Based on NCEP/ATP III, the  patient's risk factor category is "2 or more risk factors and a calculated 10 year CAD risk of < 20%".  The patient's lipid goals are as follows: Total cholesterol goal is 200; LDL cholesterol goal is 130; HDL cholesterol goal is 40; Triglyceride goal is 150.    Patient Instructions: 1)  Please schedule a follow-up appointment in 3 months with new PCP as I am relocating. Prescriptions: ADULT ASPIRIN EC LOW STRENGTH 81 MG TBEC (ASPIRIN) once daily  #30 x 3   Entered and Authorized by:   Franchot Heidelberg MD   Signed by:   Franchot Heidelberg MD on 08/20/2009   Method used:   Electronically to        [facility] [address] (retail)       [address]       [place], [place]  [address]       Ph: [phone]       Fax: [phone]   RxID:    [id number] NIASPAN 500 MG CR-TABS (NIACIN (ANTIHYPERLIPIDEMIC)) once daily  #30 x 3   Entered and Authorized by:   Franchot Heidelberg MD   Signed by:   Franchot Heidelberg MD on 08/20/2009   Method used:   Electronically to        [facility] [address] (retail)       [address]       [place], [place]  [address]       Ph: [phone]       Fax: [phone]   RxID:   [id number] LISINOPRIL-HYDROCHLOROTHIAZIDE 20-25 MG  TABS (LISINOPRIL-HYDROCHLOROTHIAZIDE) One daily  #30 Tablet x 3   Entered and Authorized by:   Franchot Heidelberg MD   Signed by:   Franchot Heidelberg MD on 08/20/2009   Method used:   Electronically to        [facility] [address] (retail)       [address]       [place], [place]  [address]       Ph: [phone]       Fax: [phone]   RxID:   [id number] DILTIAZEM HCL ER BEADS 420 MG  XR24H-CAP (DILTIAZEM HCL ER BEADS) One daily  #30 Capsule x 3   Entered and Authorized by:   Franchot Heidelberg MD   Signed by:   Franchot Heidelberg MD on 08/20/2009   Method used:   Electronically to        [facility] [address] (retail)       [address]       [place], [place]  [address]       Ph: [phone]       Fax: [phone]   RxID:   [id number]

## 2011-01-24 NOTE — Assessment & Plan Note (Signed)
Summary: 3 month follow up/arc   Vital Signs:  Patient Profile:   45 Years Old Male Height:     72 inches Weight:      283 pounds BMI:     38.52 O2 Sat:      96 % Temp:     98.0 degrees F Pulse rate:   94 / minute Resp:     14 per minute BP sitting:   151 / 78  Vitals Entered By: Sherilyn Banker (November 08, 2007 2:46 PM)                 PCP:  Franchot Heidelberg, MD  Chief Complaint:  follow up visit.  History of Present Illness: Pt in for recheck.  He states he is worried about spyhillis. He states he had sex with a girl 3 or 4 times. She called him and told him she had syphillis. but he has not been called by the HD. He states they had a fight and she called him after. He denies fever, chills and sweats. He has no penile discharge, drainage and burning. Note hx of STDs. States had GC at age 43. No report of weight loss. He has had many sexual partners - not sure how many. States a lot. Has not had testing in quite some time and curious if we can check him out. Denies urinary frequency, burning and nocturia. No penile discharge.  Now presents.  Hypertension History:      He denies headache, chest pain, palpitations, dyspnea with exertion, orthopnea, PND, peripheral edema, visual symptoms, neurologic problems, syncope, and side effects from treatment.  He notes no problems with any antihypertensive medication side effects.  Further comments include: BP high. Very nervous about syphillis. Working on weight loss.        Positive major cardiovascular risk factors include hyperlipidemia and hypertension.  Negative major cardiovascular risk factors include male age less than 36 years old, no history of diabetes, negative family history for ischemic heart disease, and non-tobacco-user status.        Further assessment for target organ damage reveals no history of ASHD, stroke/TIA, or peripheral vascular disease.    Lipid Management History:      Positive NCEP/ATP III risk factors  include HDL cholesterol less than 40 and hypertension.  Negative NCEP/ATP III risk factors include male age less than 61 years old, non-diabetic, no family history for ischemic heart disease, non-tobacco-user status, no ASHD (atherosclerotic heart disease), no prior stroke/TIA, no peripheral vascular disease, and no history of aortic aneurysm.        The patient states that he knows about the "Therapeutic Lifestyle Change" diet.  His compliance with the TLC diet is fair.  The patient expresses understanding of adjunctive measures for cholesterol lowering.  Adjunctive measures started by the patient include aerobic exercise, fiber, and omega-3 supplements.      Current Allergies (reviewed today): No known allergies   Past Medical History:    Reviewed history from 02/14/2007 and no changes required:       GERD       Hypertension  Past Surgical History:    Reviewed history from 02/14/2007 and no changes required:       Appendectomy-12/19/99       Tonsillectomy   Family History:    Reviewed history from 02/14/2007 and no changes required:       Father: 60 W&L       Mother: 51 HTN       Siblings:  Sisters x 2: 58 and 82 W&L  Social History:    Reviewed history from 02/14/2007 and no changes required:       Occupation: [employer]        Single       Never Smoked       Alcohol use-no       Drug use-no    Review of Systems      See HPI  General      Denies fatigue and malaise.  Resp      Denies cough, shortness of breath, sputum productive, and wheezing.  GI      Denies abdominal pain, constipation, diarrhea, nausea, and vomiting.  GU      Denies decreased libido, nocturia, urinary frequency, and urinary hesitancy.   Physical Exam  General:     Well-developed,well-nourished,in no acute distress; alert,appropriate and cooperative throughout examination Lungs:     Normal respiratory effort, chest expands symmetrically. Lungs are clear to auscultation, no crackles or  wheezes. Heart:     Normal rate and regular rhythm. S1 and S2 normal without gallop, murmur, click, rub or other extra sounds. Abdomen:     Bowel sounds positive,abdomen soft and non-tender without masses, organomegaly or hernias noted. Genitalia:     Testes bilaterally descended without nodularity, tenderness or masses. No scrotal masses or lesions. No penis lesions or urethral discharge. Extremities:     No clubbing, cyanosis, edema, or deformity noted with normal full range of motion of all joints.   Cervical Nodes:     No lymphadenopathy noted Inguinal Nodes:     No significant adenopathy Psych:     Cognition and judgment appear intact. Alert and cooperative with normal attention span and concentration. No apparent delusions, illusions, hallucinations    Impression & Recommendations:  Problem # 1:  SEXUAL ACTIVITY, HIGH RISK (ICD-V69.2) Hx of syphils exposure and multiple sex partners. Get full STD testing inclduing hep panel, RPR, HIV and GC culture.Await result. Councelled sfae sex. Use condoms at all times. Agrees. Orders: T-Hepatitis Profile Acute ([id number]) T-Syphilis Test (RPR) [id number]) T-HIV Antibody  (Reflex) [id number]) T- GC Chlamydia ([id number])   Problem # 2:  OBESITY NOS (ICD-278.00) TLC agian encouraged. Councelled diet, exersize and portion control.  Problem # 3:  HYPERTENSION (ICD-401.9) Meds as is. Recheck 4 weeks to optomize as he is very stressed about STDs today. Advised eye exam, dental care and low salt intake. His updated medication list for this problem includes:    Cardizem La 420 Mg Tb24 (Diltiazem hcl coated beads) ..... One by mouth daily    Micardis Hct 40-12.5 Mg Tabs (Telmisartan-hctz) ..... One by mouth daily   Problem # 4:  HYPERLIPIDEMIA (ICD-272.4) Stable. TLC only.  Problem # 5:  Preventive Health Care (ICD-V70.0) Got flu-shot at work.   Complete Medication List: 1)  Cardizem La 420 Mg Tb24 (Diltiazem hcl coated beads) ....  One by mouth daily 2)  Micardis Hct 40-12.5 Mg Tabs (Telmisartan-hctz) .... One by mouth daily  Hypertension Assessment/Plan:      The patient's hypertensive risk group is category B: At least one risk factor (excluding diabetes) with no target organ damage.  His calculated 10 year risk of coronary heart disease is 9 %.  Today's blood pressure is 151/78.  His blood pressure goal is < 140/90.  Lipid Assessment/Plan:      Based on NCEP/ATP III, the patient's risk factor category is "2 or more risk factors and a calculated 10 year CAD risk of <  20%".  From this information, the patient's calculated lipid goals are as follows: Total cholesterol goal is 200; LDL cholesterol goal is 130; HDL cholesterol goal is 40; Triglyceride goal is 150.       ]  Influenza Immunization History:    Influenza # 1:  Historical (11/07/2007)     Preventive Care Screening  Last Flu Shot:    Date:  11/07/2007    Next Due:  10/2008    Results:  Historical

## 2011-01-24 NOTE — Assessment & Plan Note (Signed)
Summary: 3 month follow up/arc   Vital Signs:  Patient Profile:   45 Years Old Male Height:     72 inches Weight:      287 pounds BMI:     39.06 O2 Sat:      98 % Temp:     98.0 degrees F Pulse rate:   76 / minute Resp:     16 per minute BP sitting:   142 / 81  Vitals Entered By: Sherilyn Banker (May 17, 2007 4:37 PM)               PCP:  Franchot Heidelberg, MD  Chief Complaint:  follow up visit.  History of Present Illness: Pt in for recheck.    Hypertension History:      He denies headache, chest pain, palpitations, dyspnea with exertion, orthopnea, PND, peripheral edema, visual symptoms, neurologic problems, syncope, and side effects from treatment.  He notes no problems with any antihypertensive medication side effects.  Further comments include: He is not watching salt intake. He has not had eye exam yet. He has not been exersizing. He is not watching fatty intake.        Positive major cardiovascular risk factors include hyperlipidemia and hypertension.  Negative major cardiovascular risk factors include male age less than 62 years old, no history of diabetes, negative family history for ischemic heart disease, and non-tobacco-user status.        Further assessment for target organ damage reveals no history of ASHD, stroke/TIA, or peripheral vascular disease.    Lipid Management History:      Positive NCEP/ATP III risk factors include HDL cholesterol less than 40 and hypertension.  Negative NCEP/ATP III risk factors include male age less than 29 years old, non-diabetic, no family history for ischemic heart disease, non-tobacco-user status, no ASHD (atherosclerotic heart disease), no prior stroke/TIA, no peripheral vascular disease, and no history of aortic aneurysm.        The patient states that he knows about the "Therapeutic Lifestyle Change" diet.  His compliance with the TLC diet is poor.  The patient expresses understanding of adjunctive measures for cholesterol lowering.   Adjunctive measures started by the patient include aerobic exercise, fiber, and omega-3 supplements.      Current Allergies (reviewed today): No known allergies   Past Medical History:    Reviewed history from 02/14/2007 and no changes required:       GERD       Hypertension  Past Surgical History:    Reviewed history from 02/14/2007 and no changes required:       Appendectomy-12/19/99       Tonsillectomy   Family History:    Reviewed history from 02/14/2007 and no changes required:       Father: 71 W&L       Mother: 25 HTN       Siblings: Sisters x 2: 64 and 55 W&L  Social History:    Reviewed history from 02/14/2007 and no changes required:       Occupation: [employer]        Single       Never Smoked       Alcohol use-no       Drug use-no    Review of Systems      See HPI  General      Denies fatigue, malaise, and sweats.  CV      Denies chest pain or discomfort, swelling of feet, swelling of hands, and  weight gain.  Resp      Denies chest discomfort, shortness of breath, sputum productive, and wheezing.  GI      Denies abdominal pain, constipation, diarrhea, nausea, and vomiting.  GU      Denies decreased libido, nocturia, urinary frequency, and urinary hesitancy.  Allergy      Complains of seasonal allergies.      Denies hives or rash, itching eyes, persistent infections, and sneezing.      Doing well with OTC.  No trouble.   Physical Exam  General:     Well-developed,well-nourished,in no acute distress; alert,appropriate and cooperative throughout examination Lungs:     Normal respiratory effort, chest expands symmetrically. Lungs are clear to auscultation, no crackles or wheezes. Heart:     Normal rate and regular rhythm. S1 and S2 normal without gallop, murmur, click, rub or other extra sounds. Abdomen:     Bowel sounds positive,abdomen soft and non-tender without masses, organomegaly or hernias noted. Extremities:     No clubbing,  cyanosis, edema, or deformity noted with normal full range of motion of all joints.      Impression & Recommendations:  Problem # 1:  ALLERGIC RHINITIS, SEASONAL (ICD-477.0) Discussed. Stable on OTCs. No change. Advised mask when outdoors. Chnage air filters regularly.  Problem # 2:  HYPERLIPIDEMIA (ICD-272.4) Discussed. Pt notes he will get with program. Start TLC. Recheck three months and if trig still up, start med.  Problem # 3:  HYPERTENSION (ICD-401.9) BP borderline high. Councelled diet, exersize and low salt intake. Meds as is. His updated medication list for this problem includes:    Cardizem La 420 Mg Tb24 (Diltiazem hcl coated beads) ..... One by mouth daily    Micardis Hct 40-12.5 Mg Tabs (Telmisartan-hctz) ..... One by mouth daily   Problem # 4:  OBESITY NOS (ICD-278.00) Long discussion about diet and exersize. Advised weight loss. Recheck three months and if still high, will need dietary consutl and eve discussion about surgical options. Aware of risk associated with this.  Hypertension Assessment/Plan:      The patient's hypertensive risk group is category B: At least one risk factor (excluding diabetes) with no target organ damage.  His calculated 10 year risk of coronary heart disease is 4 %.  Today's blood pressure is 142/81.  His blood pressure goal is < 140/90.  Lipid Assessment/Plan:      Based on NCEP/ATP III, the patient's risk factor category is "2 or more risk factors and a calculated 10 year CAD risk of < 20%".  From this information, the patient's calculated lipid goals are as follows: Total cholesterol goal is 200; LDL cholesterol goal is 130; HDL cholesterol goal is 40; Triglyceride goal is 150.     Patient Instructions: 1)  Please schedule a follow-up appointment in 3 months - sooner if needed.

## 2011-01-24 NOTE — Assessment & Plan Note (Signed)
Summary: 4 month follow up/slj   Vital Signs:  Patient Profile:   45 Years Old Male Height:     72 inches Weight:      294 pounds BMI:     40.02 O2 Sat:      95 % Temp:     98.6 degrees F Pulse rate:   90 / minute Resp:     10 per minute BP sitting:   147 / 86  Vitals Entered By: Sherilyn Banker (July 27, 2008 3:32 PM)                 PCP:  Franchot Heidelberg, MD  Chief Complaint:  follow up visit.  History of Present Illness: Pt in for recheck.  He states he is doing great. He has no concerns and feels good overall.  He needs recheck on his GERD, HTN and Hyperlipidemia.  He now presents.  Hypertension History:      He denies headache, chest pain, palpitations, dyspnea with exertion, orthopnea, PND, peripheral edema, visual symptoms, neurologic problems, syncope, and side effects from treatment.  He notes no problems with any antihypertensive medication side effects.  Further comments include: Taking meds daily. States pays $ 60 for BP meds. States would love generic as this is free.  Wtaching salt. He is exerisizng daily and does walking - one hour 5 days a week. He has not had eye exam nor dental exam. States cost prohibitive.        Positive major cardiovascular risk factors include hyperlipidemia and hypertension.  Negative major cardiovascular risk factors include male age less than 69 years old, no history of diabetes, negative family history for ischemic heart disease, and non-tobacco-user status.        Further assessment for target organ damage reveals no history of ASHD, stroke/TIA, or peripheral vascular disease.    Lipid Management History:      Positive NCEP/ATP III risk factors include HDL cholesterol less than 40 and hypertension.  Negative NCEP/ATP III risk factors include male age less than 2 years old, non-diabetic, no family history for ischemic heart disease, non-tobacco-user status, no ASHD (atherosclerotic heart disease), no prior stroke/TIA, no  peripheral vascular disease, and no history of aortic aneurysm.        The patient states that he knows about the "Therapeutic Lifestyle Change" diet.  His compliance with the TLC diet is fair.  The patient expresses understanding of adjunctive measures for cholesterol lowering.  Adjunctive measures started by the patient include aerobic exercise, fiber, ASA, and omega-3 supplements.  Comments: Agrees with labs.      Prior Medications Reviewed Using: Patient Recall  Updated Prior Medication List: CARDIZEM LA 420 MG TB24 (DILTIAZEM HCL COATED BEADS) One by mouth daily MICARDIS HCT 40-12.5 MG TABS (TELMISARTAN-HCTZ) One by mouth daily CYCLOBENZAPRINE HCL 10 MG  TABS (CYCLOBENZAPRINE HCL) One three times a day as need for mucle sprain EC-NAPROSYN 500 MG  TBEC (NAPROXEN) two times a day  Current Allergies (reviewed today): No known allergies   Past Medical History:    Reviewed history from 03/27/2008 and no changes required:       SEXUAL ACTIVITY, HIGH RISK (ICD-V69.2)       OBESITY NOS (ICD-278.00)       ALLERGIC RHINITIS, SEASONAL (ICD-477.0)       HYPERLIPIDEMIA (ICD-272.4)       HYPERTENSION (ICD-401.9)       GERD (ICD-530.81)         Past Surgical History:  Reviewed history from 02/14/2007 and no changes required:       Appendectomy-12/19/99       Tonsillectomy   Family History:    Reviewed history from 02/14/2007 and no changes required:       Father: 69 W&L       Mother: 16 HTN       Siblings: Sisters x 2: 66 and 6 W&L  Social History:    Reviewed history from 02/14/2007 and no changes required:       Occupation: [employer]es        Single       Never Smoked       Alcohol use-no       Drug use-no   Risk Factors: Tobacco use:  never Drug use:  no Alcohol use:  no  Family History Risk Factors:    Family History of MI in females < 51 years old:  no    Family History of MI in males < 1 years old:  no   Review of Systems      See HPI  General      Denies  chills, fever, and sweats.  CV      Denies chest pain or discomfort, swelling of feet, swelling of hands, and weight gain.  Resp      Denies cough, shortness of breath, sputum productive, and wheezing.  GI      Denies abdominal pain, constipation, diarrhea, nausea, and vomiting.      Denies GERD. Not on Rx.  GU      Denies nocturia, urinary frequency, and urinary hesitancy.   Physical Exam  General:     Well-developed,well-nourished,in no acute distress; alert,appropriate and cooperative throughout examination Lungs:     Normal respiratory effort, chest expands symmetrically. Lungs are clear to auscultation, no crackles or wheezes. Heart:     Normal rate and regular rhythm. S1 and S2 normal without gallop, murmur, click, rub or other extra sounds. Abdomen:     Bowel sounds positive,abdomen soft and non-tender without masses, organomegaly or hernias noted. Extremities:     No clubbing, cyanosis, edema, or deformity noted with normal full range of motion of all joints.   Cervical Nodes:     No lymphadenopathy noted Psych:     Cognition and judgment appear intact. Alert and cooperative with normal attention span and concentration. No apparent delusions, illusions, hallucinations    Impression & Recommendations:  Problem # 1:  HYPERTENSION (ICD-401.9) $60 copay for branded products and cannot afford. DC and start generics. Recheck 6 weeks and optomize. Check renal function and lytes. Advised eye and dental care and states will likley be able to afford given cost saving with Rx cost. The following medications were removed from the medication list:    Micardis Hct 40-12.5 Mg Tabs (Telmisartan-hctz) ..... One by mouth daily  His updated medication list for this problem includes:    Diltiazem Hcl Er Beads 420 Mg Xr24h-cap (Diltiazem hcl er beads) ..... One daily    Lisinopril-hydrochlorothiazide 20-25 Mg Tabs (Lisinopril-hydrochlorothiazide) ..... One  daily  Orders: T-Comprehensive Metabolic Panel [id number]) T-CBC w/Diff ([id number])   Problem # 2:  HYPERLIPIDEMIA (ICD-272.4) Repeat labs and optomize. Orders: T-Comprehensive Metabolic Panel [id number]) T-Lipid Profile [id number]) T-TSH ([id number])   Problem # 3:  OBESITY NOS (ICD-278.00) Again councelled TLC.  Problem # 4:  GERD (ICD-530.81) Stable. No RX.TLC and weight loss only.  Problem # 5:  Preventive Health Care (ICD-V70.0) Well male exam on return.Check PSa in anticipation  and do rectal on return.  Complete Medication List: 1)  Diltiazem Hcl Er Beads 420 Mg Xr24h-cap (Diltiazem hcl er beads) .... One daily 2)  Lisinopril-hydrochlorothiazide 20-25 Mg Tabs (Lisinopril-hydrochlorothiazide) .... One daily  Other Orders: T-PSA  ([id number])  Hypertension Assessment/Plan:      The patient's hypertensive risk group is category B: At least one risk factor (excluding diabetes) with no target organ damage.  His calculated 10 year risk of coronary heart disease is 11 %.  Today's blood pressure is 147/86.  His blood pressure goal is < 140/90.  Lipid Assessment/Plan:      Based on NCEP/ATP III, the patient's risk factor category is "2 or more risk factors and a calculated 10 year CAD risk of < 20%".  From this information, the patient's calculated lipid goals are as follows: Total cholesterol goal is 200; LDL cholesterol goal is 130; HDL cholesterol goal is 40; Triglyceride goal is 150.     Patient Instructions: 1)  Please schedule a follow-up appointment in 6 weeks.   Prescriptions: LISINOPRIL-HYDROCHLOROTHIAZIDE 20-25 MG  TABS (LISINOPRIL-HYDROCHLOROTHIAZIDE) One daily  #30 x 3   Entered and Authorized by:   Franchot Heidelberg MD   Signed by:   Franchot Heidelberg MD on 07/27/2008   Method used:   Print then Give to Patient   RxID:   [id number] DILTIAZEM HCL ER BEADS 420 MG  XR24H-CAP (DILTIAZEM HCL ER BEADS) One daily  #30 x 3   Entered and Authorized  by:   Franchot Heidelberg MD   Signed by:   Franchot Heidelberg MD on 07/27/2008   Method used:   Print then Give to Patient   RxID:   [id number]  ]

## 2011-01-24 NOTE — Letter (Signed)
Summary: CARDIZEM REFILL  CARDIZEM REFILL   Imported By: Magdalene River 03/18/2007 15:27:19  _____________________________________________________________________  External Attachment:    Type:   Image     Comment:   External Document

## 2011-01-31 ENCOUNTER — Encounter: Payer: BC Managed Care – PPO | Admitting: Orthopedic Surgery

## 2011-01-31 ENCOUNTER — Ambulatory Visit (INDEPENDENT_AMBULATORY_CARE_PROVIDER_SITE_OTHER): Payer: BC Managed Care – PPO | Admitting: Orthopedic Surgery

## 2011-01-31 DIAGNOSIS — M169 Osteoarthritis of hip, unspecified: Secondary | ICD-10-CM | POA: Insufficient documentation

## 2011-01-31 DIAGNOSIS — M25559 Pain in unspecified hip: Secondary | ICD-10-CM | POA: Insufficient documentation

## 2011-01-31 DIAGNOSIS — M5430 Sciatica, unspecified side: Secondary | ICD-10-CM

## 2011-01-31 DIAGNOSIS — M1610 Unilateral primary osteoarthritis, unspecified hip: Secondary | ICD-10-CM | POA: Insufficient documentation

## 2011-02-09 NOTE — Assessment & Plan Note (Signed)
Summary: ap er left hip pain may need new xr/bcbs/bsf   Vital Signs:  Patient profile:   45 year old male Height:      73 inches Weight:      285 pounds Pulse rate:   68 / minute Resp:     16 per minute  Vitals Entered By: Fuller Canada MD (January 31, 2011 2:31 PM)   Visit Type:  new patient Referring Provider:  ap er Primary Provider:  Lilyan Punt, MD  CC:  left hip pain.  History of Present Illness: I saw Caleb Ballard in the office today for an initial visit.  He is a 45 years old man with the complaint of:  left hip pain.  No injury.  Xrays of the left hip 01/20/11.  Meds: Diltiazem, Lisinopril, Percocet 5/325mg  and Etodolac 400mg  two times a day.  The patient presents to [place] complaining of severe pain in his LEFT lateral hip and thigh radiating down the anterior and lateral thigh not associated with any back pain but associated with lying flat in standing for more than 5 minutes.  The pain is described as sharp and throbbing and reaches a level of 8/10 on relieved by ETODOLAC 400 mg and hydrocodone and only partially relieved by oxycodone with acetaminophen 7.5 mg with 325 mg of Tylenol.  He denies any back pain.  He describes a burning sensation in the LEFT lateral thigh.        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 Cardiovascular:  Denies chest pain, palpitations, fainting, and murmurs. Respiratory:  Denies short of breath, wheezing, couch, tightness, pain on inspiration, and snoring . Gastrointestinal:  Denies  heartburn, nausea, vomiting, diarrhea, constipation, and blood in your stools. Genitourinary:  Denies frequency, urgency, difficulty urinating, painful urination, flank pain, and bleeding in urine. Neurologic:  Denies numbness, tingling, unsteady gait, dizziness, tremors, and seizure. Musculoskeletal:  Denies joint pain, swelling, instability, stiffness, redness, heat, and muscle pain. Endocrine:  Denies excessive thirst, exessive urination, and heat or cold intolerance. Psychiatric:  Denies nervousness, depression, anxiety, and hallucinations. Skin:  Denies changes in the skin, poor healing, rash, itching, and redness. HEENT:  Denies blurred or double vision, eye pain, redness, and watering. Immunology:  Complains of seasonal allergies; denies sinus problems and allergic to bee stings. Hemoatologic:  Denies easy bleeding and brusing.  Physical Exam  Skin:  intact without lesions or rashes Inguinal Nodes:  no significant adenopathy Psych:  alert and cooperative; normal mood and affect; normal attention span and concentration   Hip Exam  General:    Well-developed,well-nourished,normal body habitus;no deformities, normal grooming.  Gait:    Normal heel-toe gait pattern bilaterally.    Skin:    Intact, no scars,lesions,rashes,cafe au lait spots,bruising.  Inspection:    No deformity, ecchymosis or swelling.   Palpation:    nontender over the lumbar spine, greater trochanter.  Vascular:    There was no swelling or varicose veins.The pulses and temperature is normal.There was no edema or tenderness.  Sensory:    Gross coordination and sensation were normal.  Motor:    Motor strength 5/5 bilaterally for quadriceps, hamstrings, ankle dorsiflexion, ankle plantar flexion, .  Reflexes:    Normal and symmetric patellar and Achilles reflexes bilaterally.    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 Once daily  Orders: New Patient Level III (98119)  Problem # 3:  SCIATICA (ICD-724.3)  His updated medication list for this problem includes:    Adult Aspirin Ec Low Strength 81 Mg Tbec (Aspirin) ..... Once daily  Orders: New Patient Level III (14782) Lumbosacral Spine ,2/3 views (72100) x-rays were obtained at the hospital included a pelvis and bilateral hips.  He has marginal osteophytes at the acetabulum with loss of femoral offset but no major arthritic changes.  I can't imagine that this would cause him severe pain.  X-rays of the L-spine were done which show that he does not have any major degenerative disc space narrowing or spinal alignment abnormality  Differential diagnosis acetabular impingement syndrome with a combination of cam and  pincer type impingement or early sciatica, proximal nerve impingement from disc material  Recommend steroid dosepak and Neurontin 100 mg titrated up to 300 mg at night  Stay out of work additional 2 weeks.  Last day worked January 20.  Medications Added to Medication List This Visit: 1)  Prednisone (pak) 10 Mg Tabs (Prednisone) .... As directed for 12 days 2)  Neurontin 100 Mg Caps (Gabapentin) .Marland Kitchen.. 1 by mouth at night on 1st day 2nd day take 2 at night 3rd day and continuing days take 3 at night  Patient Instructions: 1)  Take meds as directed 2)  come back in 2 weeks for a recheck 3)  OOW for 2 more weeks, last day of work was 01/13/11 Prescriptions: NEURONTIN 100 MG  CAPS (GABAPENTIN) 1 by mouth at night on 1st day 2nd day take 2 at night 3rd day and continuing days take 3 at night  #90 x 1   Entered and Authorized by:   Fuller Canada MD   Signed by:   Fuller Canada MD on 01/31/2011   Method used:   Faxed to ...       [facility]  [address] [facility]       [address]       [place], [place]  [address]       Ph: [phone]       Fax: [phone]   RxID:   [id number] PREDNISONE (PAK) 10 MG TABS (PREDNISONE) as directed for 12 days  #1 pak x 0   Entered and Authorized by:   Fuller Canada MD   Signed by:   Fuller Canada MD on 01/31/2011   Method used:   Faxed to ...       [facility]  [address] [address] [facility]  [address]       [place], [place]  [address]       Ph: [phone]       Fax: [phone]   RxID:   [id number]    Orders Added: 1)  New Patient Level III [84132] 2)  Lumbosacral Spine ,2/3 views [72100]

## 2011-02-09 NOTE — Letter (Signed)
Summary: *Orthopedic Consult Note  [facility]  [address]  [place], [place] [address]   Phone: [phone]  Fax: [phone]    Re:    ABED SCHAR DOB:    Aug 31, 1966   Dear: Lorin Picket   Thank you for requesting that we see the above patient for consultation.  A copy of the detailed office note will be sent under separate cover, for your review.  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  Sincerely,   Terrance Mass. MD.

## 2011-02-09 NOTE — Letter (Signed)
Summary: Out of Work  [facility]  [address]  [place], [place] [address]   Phone: [phone]  Fax: [phone]    January 31, 2011   Employee:  Caleb Ballard    To Whom It May Concern:   For Medical reasons, please excuse the above named employee from work for the following dates:  Start:   01/13/11  End:   Until after next appointment that is scheduled for 02/14/11  If you need additional information, please feel free to contact our office.         Sincerely,    Dr. Wyline Copas.Montez Hageman

## 2011-02-14 ENCOUNTER — Ambulatory Visit: Payer: BC Managed Care – PPO | Admitting: Orthopedic Surgery

## 2011-02-14 ENCOUNTER — Ambulatory Visit (INDEPENDENT_AMBULATORY_CARE_PROVIDER_SITE_OTHER): Payer: BC Managed Care – PPO | Admitting: Orthopedic Surgery

## 2011-02-14 ENCOUNTER — Encounter: Payer: Self-pay | Admitting: Orthopedic Surgery

## 2011-02-14 DIAGNOSIS — M169 Osteoarthritis of hip, unspecified: Secondary | ICD-10-CM

## 2011-02-20 ENCOUNTER — Encounter: Payer: Self-pay | Admitting: Orthopedic Surgery

## 2011-02-20 ENCOUNTER — Telehealth: Payer: Self-pay | Admitting: Orthopedic Surgery

## 2011-02-21 ENCOUNTER — Encounter: Payer: Self-pay | Admitting: Orthopedic Surgery

## 2011-02-21 NOTE — Letter (Signed)
Summary: Out of Work  [facility]  [address]  [place], [place] [address]   Phone: [phone]  Fax: [phone]    February 14, 2011   Employee:  Caleb Ballard    To Whom It May Concern:   For Medical reasons, please excuse the above named employee from work for the following dates:  Start:    01/13/11  End:               02/28/11, or until further notice.   If you need additional information, please feel free to contact our office.         Sincerely,    Terrance Mass, MD

## 2011-02-21 NOTE — Assessment & Plan Note (Signed)
Summary: 2 we reck after meds/bcbs/bsf   Visit Type:  Follow-up Referring Provider:  ap er Primary Provider:  Lilyan Punt, MD  CC:  LEFT HIP/THIGH PAIN .  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 Once daily  Orders: New Patient Level III (64332)  Problem # 3:  SCIATICA (ICD-724.3)  MEDS steroids helped him getting out of bed in the am  1)  Prednisone (pak) 10 Mg Tabs (Prednisone) .... As directed for 12 days 2)  Neurontin 100 Mg Caps (Gabapentin) .Marland Kitchen.. 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 Once daily    Norco 5-325 Mg Tabs (Hydrocodone-acetaminophen) .Marland Kitchen... 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  Recommend MRI lumbar spine and hip to differentiate hip arthritis/impingement syndrome versus herniated disc  Orders: Est. Patient Level II (95188)  Medications Added to Medication List This Visit: 1)  Norco 5-325 Mg Tabs (Hydrocodone-acetaminophen) .Marland Kitchen.. 1 by mouth q 4 as needed pain  Patient Instructions: 1)  MRI:   L SPINE AND PELVIS  2)  CONTINUE NEURONTIN 3 TABS AT NIGHT  3)  AND TAKE NORCO 5 MG Q 4 as needed PAIN  4)  OOW CONTINUE  Prescriptions: NORCO 5-325 MG TABS (HYDROCODONE-ACETAMINOPHEN) 1 by mouth Q 4 as needed PAIN  #42 x 1   Entered and Authorized by:   Fuller Canada MD   Signed by:   Fuller Canada MD on 02/14/2011   Method used:   Print then Give to Patient   RxID:   [id number]    Orders Added: 1)  Est. Patient Level II [35573]

## 2011-02-21 NOTE — Letter (Signed)
Summary: History form  History form   Imported By: Jacklynn Ganong 02/14/2011 14:37:39  _____________________________________________________________________  External Attachment:    Type:   Image     Comment:   External Document

## 2011-02-22 ENCOUNTER — Encounter: Payer: Self-pay | Admitting: Orthopedic Surgery

## 2011-02-24 ENCOUNTER — Other Ambulatory Visit: Payer: Self-pay | Admitting: Orthopedic Surgery

## 2011-02-24 ENCOUNTER — Encounter: Payer: Self-pay | Admitting: Orthopedic Surgery

## 2011-02-24 ENCOUNTER — Telehealth: Payer: Self-pay | Admitting: Orthopedic Surgery

## 2011-02-24 DIAGNOSIS — M25552 Pain in left hip: Secondary | ICD-10-CM

## 2011-02-27 ENCOUNTER — Ambulatory Visit (HOSPITAL_COMMUNITY)
Admission: RE | Admit: 2011-02-27 | Discharge: 2011-02-27 | Disposition: A | Discharge status: Home / Self Care | Payer: BC Managed Care – PPO | Source: Ambulatory Visit | Attending: Orthopedic Surgery | Admitting: Orthopedic Surgery

## 2011-02-27 DIAGNOSIS — M25559 Pain in unspecified hip: Secondary | ICD-10-CM | POA: Insufficient documentation

## 2011-02-27 DIAGNOSIS — M5126 Other intervertebral disc displacement, lumbar region: Secondary | ICD-10-CM | POA: Insufficient documentation

## 2011-02-27 DIAGNOSIS — M25552 Pain in left hip: Secondary | ICD-10-CM

## 2011-02-28 ENCOUNTER — Encounter: Payer: Self-pay | Admitting: Orthopedic Surgery

## 2011-03-01 ENCOUNTER — Ambulatory Visit (INDEPENDENT_AMBULATORY_CARE_PROVIDER_SITE_OTHER): Payer: BC Managed Care – PPO | Admitting: Orthopedic Surgery

## 2011-03-01 ENCOUNTER — Encounter: Payer: Self-pay | Admitting: Orthopedic Surgery

## 2011-03-01 ENCOUNTER — Telehealth: Payer: Self-pay | Admitting: Orthopedic Surgery

## 2011-03-01 DIAGNOSIS — M5126 Other intervertebral disc displacement, lumbar region: Secondary | ICD-10-CM

## 2011-03-02 NOTE — Letter (Signed)
Summary: BCBS denial letter MRI pelvis  BCBS denial letter MRI pelvis   Imported By: Cammie Sickle 02/21/2011 11:42:11  _____________________________________________________________________  External Attachment:    Type:   Image     Comment:   External Document

## 2011-03-02 NOTE — Letter (Signed)
Summary: Fax of clinicals re:MRI BCBS/AIM Appeals/review  Fax of clinicals re:MRI BCBS/AIM Appeals/review   Imported By: Cammie Sickle 02/22/2011 10:18:24  _____________________________________________________________________  External Attachment:    Type:   Image     Comment:   External Document

## 2011-03-02 NOTE — Letter (Addendum)
Summary: Out of Work  [facility]  [address]  [place], [place] [address]   Phone: [phone]  Fax: [phone]    February 24, 2011   Employee:  Caleb Ballard    To Whom It May Concern:   For Medical reasons, please excuse the above named employee from work for the following dates:  Start:   01/13/11  End:   03/01/11                     (Next scheduled appointment:  03/01/11)   Estimated Return to work date:  03/02/11  If you need additional information, please feel free to contact our office.         Sincerely,    Terrance Mass, MD

## 2011-03-02 NOTE — Letter (Signed)
Summary: [employer] disability form  [employer] disability form   Imported By: Cammie Sickle 02/21/2011 20:11:08  _____________________________________________________________________  External Attachment:    Type:   Image     Comment:   External Document

## 2011-03-02 NOTE — Letter (Signed)
Summary: Auth MRI of Pelvis [employer]   Auth MRI of Pelvis [employer]   Imported By: Cammie Sickle 02/24/2011 12:58:39  _____________________________________________________________________  External Attachment:    Type:   Image     Comment:   External Document

## 2011-03-02 NOTE — Progress Notes (Signed)
Summary: MRI of pelvis not approved  Phone Note Outgoing Call   Call placed to: Patient Summary of Call: Called patient to notify of response from BCBS's review of MRI for pelvis - not approved, per faxed letter received today.  Patient will contact insurer and discuss options, including possible appeal.  MRI for L-spine still pending review.    * Patient called back, states spoke w/Dee, who relayed that they may need additional information.  They reviewed options of appeal process. Initial call taken by: Cammie Sickle,  February 20, 2011 2:15 PM  Follow-up for Phone Call        Our office followed up directly with [employer]BS.  Further information and decision from them to follow. Follow-up by: Cammie Sickle,  February 21, 2011 11:49 AM

## 2011-03-02 NOTE — Miscellaneous (Signed)
Summary: phone call   phone message left    Clinical Lists Changes

## 2011-03-02 NOTE — Progress Notes (Signed)
Summary: MRI appointment  Phone Note Outgoing Call   Call placed by: Waldon Reining,  February 24, 2011 9:35 AM Call placed to: Specialist Action Taken: Appt scheduled Summary of Call: Patient has an MRI appointment at [facility] on 02-27-11 at 11:30. Patient has BCBS, authorization [id number] for MRI of Pelvis. Patient is aware of his appointment and will follow up here at our office.

## 2011-03-07 ENCOUNTER — Encounter: Payer: Self-pay | Admitting: Orthopedic Surgery

## 2011-03-07 NOTE — Miscellaneous (Signed)
  ETODOLAC 400 mg and hydrocodone and only partially relieved by oxycodone with acetaminophen 7.5 mg with 325 mg of Tylenol.  He denies any back pain.  He describes a burning sensation in the LEFT lateral thigh.  1)  Prednisone (pak) 10 Mg Tabs (Prednisone) .... As directed for 12 days 2)  Neurontin 100 Mg Caps (Gabapentin) .Marland Kitchen.. 1 by mouth at night on 1st day 2nd day take 2 at night 3rd day and continuing days take 3 at night Clinical Lists Changes

## 2011-03-07 NOTE — Assessment & Plan Note (Signed)
Summary: REVIEW MRI PELVIS RESULTS ([facility])/BRING'G FILM/BCBS/CAF   Visit Type:  Follow-up Referring Provider:  ap er Primary Provider:  Lilyan Punt, MD  CC:  mri results.  History of Present Illness: I saw [facility] in the office today for a followup visit.  He is a 45 years old man with the complaint of:  hip and butocks pain.  MRI of pelvis was approved, [facility] 02/27/11 for review.  Norco 5, Neurontin 300mg  at night, Prednisone Pak was given initially.  Still pain in left hip to thigh, no numbness.  IMPRESSION:  1.  Normal MRI of the left hip. 2.  Prominent disc protrusion at L4-5 to the left which probably affects the left L4 and possibly L5 nerve roots  Pain level is around 0 with sitting, has pain while standing around level of 10.  I reviewed his MRI of his hip his hip is normal. We asked for an MRI of his back, and it was declined. He now needs an MRI of his back,.  He'll have to stay out of work an additional 4 weeks.   Allergies: No Known Drug Allergies   Impression & Recommendations:  Problem # 1:  H N P-LUMBAR (ICD-722.10) Assessment Comment Only  Orders: Est. Patient Level II (14782)  Patient Instructions: 1)  you have a protruding disc and a pinched nerve  2)  you will need another MRI this time of your back  3)  continue work status as is and same medications  4)  you will come back after the MRI    Orders Added: 1)  Est. Patient Level II [95621]

## 2011-03-07 NOTE — Letter (Signed)
Summary: Out of Work  [facility]  [address]  [place], [place] [address]   Phone: [phone]  Fax: [phone]    March 01, 2011   Employee:  DALESSANDRO BALDYGA    To Whom It May Concern:   For Medical reasons, please excuse the above named employee from work for the following dates:  Start:   01/13/11  Continue out of work status X 4 weeks  End/Estimated Return to work:   04/05/11  If you need additional information, please feel free to contact our office.         Sincerely,    Terrance Mass, MD

## 2011-03-07 NOTE — Letter (Signed)
Summary: FMLA form  FMLA form   Imported By: Cammie Sickle 03/01/2011 18:00:04  _____________________________________________________________________  External Attachment:    Type:   Image     Comment:   External Document

## 2011-03-07 NOTE — Progress Notes (Signed)
Summary: question from patient  Phone Note Call from Patient   Summary of Call: Harce Volden called asking for you to call him at [phone].  I called him back and told him you are out of the office but I would take the message.  He said he has talked with his  insurance company and said they will not do anything until he has had 6 weeks of treatment. He  said he just wants to know what he is suppose to do? Initial call taken by: Jacklynn Ganong,  March 01, 2011 2:14 PM  Follow-up for Phone Call        i have no other info for him   he will have to wait per his insurer  Follow-up by: Fuller Canada MD,  March 02, 2011 8:37 AM  Additional Follow-up for Phone Call Additional follow up Details #1::        Advised the patient of doctor's reply Additional Follow-up by: Jacklynn Ganong,  March 02, 2011 10:13 AM

## 2011-03-09 ENCOUNTER — Encounter: Payer: Self-pay | Admitting: Orthopedic Surgery

## 2011-03-10 ENCOUNTER — Emergency Department (HOSPITAL_COMMUNITY)
Admission: EM | Admit: 2011-03-10 | Discharge: 2011-03-10 | Disposition: A | Discharge status: Home / Self Care | Payer: BC Managed Care – PPO | Attending: Emergency Medicine | Admitting: Emergency Medicine

## 2011-03-10 ENCOUNTER — Emergency Department (HOSPITAL_COMMUNITY): Payer: BC Managed Care – PPO

## 2011-03-10 DIAGNOSIS — Z79899 Other long term (current) drug therapy: Secondary | ICD-10-CM | POA: Insufficient documentation

## 2011-03-10 DIAGNOSIS — M79609 Pain in unspecified limb: Secondary | ICD-10-CM | POA: Insufficient documentation

## 2011-03-10 DIAGNOSIS — R29898 Other symptoms and signs involving the musculoskeletal system: Secondary | ICD-10-CM | POA: Insufficient documentation

## 2011-03-10 DIAGNOSIS — I10 Essential (primary) hypertension: Secondary | ICD-10-CM | POA: Insufficient documentation

## 2011-03-10 DIAGNOSIS — M545 Low back pain, unspecified: Secondary | ICD-10-CM | POA: Insufficient documentation

## 2011-03-10 DIAGNOSIS — R209 Unspecified disturbances of skin sensation: Secondary | ICD-10-CM | POA: Insufficient documentation

## 2011-03-13 ENCOUNTER — Telehealth: Payer: Self-pay | Admitting: Orthopedic Surgery

## 2011-03-14 ENCOUNTER — Telehealth: Payer: Self-pay | Admitting: Orthopedic Surgery

## 2011-03-14 ENCOUNTER — Telehealth: Payer: Self-pay | Admitting: *Deleted

## 2011-03-14 DIAGNOSIS — M5126 Other intervertebral disc displacement, lumbar region: Secondary | ICD-10-CM

## 2011-03-14 NOTE — Letter (Signed)
Summary: Work [facility]  [address]  [place], [place] [address]   Phone: [phone]  Fax: [phone]     March 07, 2011  Name of Patient: Caleb Ballard  The above named patient had a medical visit on 03/01/11.  Please take this into consideration when reviewing the time away from work.    Special Instructions:  [ X ] Unable to perform functions of job.           Continue out of work status X4 more weeks           -through 04/05/11 or until further notice.        Sincerely,   Terrance Mass, MD

## 2011-03-14 NOTE — Progress Notes (Signed)
Will have esi @ L4-5

## 2011-03-14 NOTE — Telephone Encounter (Signed)
Phone call ref L spine MRI   ESI series   80% chance of working  If not spine consult for surgery

## 2011-03-14 NOTE — Letter (Signed)
Summary: Work note updated faxed to employer  Work note updated faxed to employer   Imported By: Cammie Sickle 03/09/2011 18:42:11  _____________________________________________________________________  External Attachment:    Type:   Image     Comment:   External Document

## 2011-03-14 NOTE — Telephone Encounter (Signed)
Faxed to pharmacy

## 2011-03-14 NOTE — Letter (Signed)
Summary: [employer] short term disab forms faxed  [employer] short term disab forms faxed   Imported By: Cammie Sickle 03/09/2011 20:02:49  _____________________________________________________________________  External Attachment:    Type:   Image     Comment:   External Document

## 2011-03-16 ENCOUNTER — Telehealth: Payer: Self-pay | Admitting: *Deleted

## 2011-03-16 DIAGNOSIS — M549 Dorsalgia, unspecified: Secondary | ICD-10-CM

## 2011-03-16 NOTE — Telephone Encounter (Signed)
Made referral for ESI injections

## 2011-03-17 ENCOUNTER — Other Ambulatory Visit: Payer: Self-pay | Admitting: Orthopedic Surgery

## 2011-03-17 DIAGNOSIS — M545 Low back pain, unspecified: Secondary | ICD-10-CM

## 2011-03-20 ENCOUNTER — Ambulatory Visit
Admission: RE | Admit: 2011-03-20 | Discharge: 2011-03-20 | Disposition: A | Discharge status: Home / Self Care | Payer: BC Managed Care – PPO | Source: Ambulatory Visit | Attending: Orthopedic Surgery | Admitting: Orthopedic Surgery

## 2011-03-20 ENCOUNTER — Other Ambulatory Visit: Payer: Self-pay | Admitting: Orthopedic Surgery

## 2011-03-20 DIAGNOSIS — M545 Low back pain, unspecified: Secondary | ICD-10-CM

## 2011-03-23 NOTE — Letter (Signed)
Summary: job description   job description   Imported By: Cammie Sickle 03/13/2011 20:43:34  _____________________________________________________________________  External Attachment:    Type:   Image     Comment:   External Document

## 2011-03-23 NOTE — Progress Notes (Signed)
Summary: patient had MRI done following ER at [facility]  Phone Note Call from Patient   Summary of Call: Patient relates that he went to [facility] Emergency Room over the weekend (03/10/11) for severe lower back and leg pain. States that he had MRI done at [facility].  He has a copy of the film and the report.   Appointment needed or review and call to patient?  Patient home ph # [phone] Cell # [phone]. Initial call taken by: Cammie Sickle,  March 13, 2011 11:09 AM  Follow-up for Phone Call        no we cant take any non emergency appts thsi week or next week   please alert the staff   Follow-up by: Fuller Canada MD,  March 13, 2011 12:20 PM  Additional Follow-up for Phone Call Additional follow up Details #1::        advised patient and scheduled appointment accordingly. Additional Follow-up by: Cammie Sickle,  March 13, 2011 12:41 PM

## 2011-03-28 ENCOUNTER — Encounter: Payer: Self-pay | Admitting: Orthopedic Surgery

## 2011-04-03 ENCOUNTER — Ambulatory Visit
Admission: RE | Admit: 2011-04-03 | Discharge: 2011-04-03 | Disposition: A | Discharge status: Home / Self Care | Payer: BC Managed Care – PPO | Source: Ambulatory Visit | Attending: Orthopedic Surgery | Admitting: Orthopedic Surgery

## 2011-04-03 DIAGNOSIS — M545 Low back pain, unspecified: Secondary | ICD-10-CM

## 2011-04-11 ENCOUNTER — Ambulatory Visit (INDEPENDENT_AMBULATORY_CARE_PROVIDER_SITE_OTHER): Payer: BC Managed Care – PPO | Admitting: Orthopedic Surgery

## 2011-04-11 DIAGNOSIS — M5126 Other intervertebral disc displacement, lumbar region: Secondary | ICD-10-CM

## 2011-04-18 ENCOUNTER — Encounter: Payer: Self-pay | Admitting: Orthopedic Surgery

## 2011-04-18 ENCOUNTER — Ambulatory Visit (INDEPENDENT_AMBULATORY_CARE_PROVIDER_SITE_OTHER): Payer: BC Managed Care – PPO | Admitting: Orthopedic Surgery

## 2011-04-18 DIAGNOSIS — M5126 Other intervertebral disc displacement, lumbar region: Secondary | ICD-10-CM

## 2011-04-18 NOTE — Progress Notes (Signed)
Mr. Caleb Ballard is much improved with the epidurals x2 and he wishes to return to work  He is taking occasional hydrocodone and his pain is much less he only had symptoms intermittently.

## 2011-05-15 ENCOUNTER — Other Ambulatory Visit: Payer: Self-pay | Admitting: Orthopedic Surgery

## 2011-05-15 DIAGNOSIS — M545 Low back pain, unspecified: Secondary | ICD-10-CM

## 2011-05-16 ENCOUNTER — Ambulatory Visit
Admission: RE | Admit: 2011-05-16 | Discharge: 2011-05-16 | Disposition: A | Discharge status: Home / Self Care | Payer: BC Managed Care – PPO | Source: Ambulatory Visit | Attending: Orthopedic Surgery | Admitting: Orthopedic Surgery

## 2011-05-16 DIAGNOSIS — M545 Low back pain, unspecified: Secondary | ICD-10-CM

## 2013-04-09 ENCOUNTER — Encounter: Payer: Self-pay | Admitting: *Deleted

## 2013-04-11 ENCOUNTER — Ambulatory Visit (INDEPENDENT_AMBULATORY_CARE_PROVIDER_SITE_OTHER): Payer: BC Managed Care – PPO | Admitting: Family Medicine

## 2013-04-11 ENCOUNTER — Encounter: Payer: Self-pay | Admitting: Family Medicine

## 2013-04-11 VITALS — BP 154/96 | HR 70 | Wt 305.0 lb

## 2013-04-11 DIAGNOSIS — I10 Essential (primary) hypertension: Secondary | ICD-10-CM

## 2013-04-11 DIAGNOSIS — E119 Type 2 diabetes mellitus without complications: Secondary | ICD-10-CM

## 2013-04-11 DIAGNOSIS — E785 Hyperlipidemia, unspecified: Secondary | ICD-10-CM

## 2013-04-11 LAB — POCT GLYCOSYLATED HEMOGLOBIN (HGB A1C): Hemoglobin A1C: 6.9

## 2013-04-11 MED ORDER — DILTIAZEM HCL ER BEADS 420 MG PO CP24: 420.0000 mg | ORAL_CAPSULE | Freq: Every day | ORAL | Status: DC

## 2013-04-11 MED ORDER — BENAZEPRIL-HYDROCHLOROTHIAZIDE 20-25 MG PO TABS: 1.0000 | ORAL_TABLET | Freq: Every day | ORAL | Status: DC

## 2013-04-11 MED ORDER — FLUTICASONE PROPIONATE 50 MCG/ACT NA SUSP: 2.0000 | Freq: Every day | NASAL | Status: DC

## 2013-04-11 NOTE — Progress Notes (Signed)
  Subjective:    Patient ID: Caleb Ballard, male    DOB: 1965/12/26, 47 y.o.   MRN: [id number]  Diabetes He presents for his follow-up diabetic visit. He has type 2 diabetes mellitus. His disease course has been worsening. Symptoms are worsening. There are no known risk factors for coronary artery disease. Current diabetic treatment includes diet. He is compliant with treatment most of the time. He is following a diabetic diet. When asked about meal planning, he reported none. He has not had a previous visit with a dietician. He participates in exercise every other day. His home blood glucose trend is fluctuating minimally. His breakfast blood glucose is taken between 7-8 am. His breakfast blood glucose range is generally 90-110 mg/dl. An ACE inhibitor/angiotensin II receptor blocker is being taken. Eye exam is current.    Results for orders placed in visit on 04/11/13  POCT GLYCOSYLATED HEMOGLOBIN (HGB A1C)      Result Value Range   Hemoglobin A1C 6.9     Patient claims compliance with his blood pressure medication. He is having quite a bit of difficulty with his allergies. Has not completely start his allergy meds. Keeping him busy schedule not exercising as much as he would hope.  Review of Systems no headache, no chest pain no shortness breath no abdominal pain no change in bowel habits ROS otherwise negative.    Objective:   Physical Exam  Alert obesity present. HEENT normal. Vitals reviewed. Lungs clear. Heart regular in rhythm. Feet without significant edema. Sensation intact. Pulses good.  Results for orders placed in visit on 04/11/13  POCT GLYCOSYLATED HEMOGLOBIN (HGB A1C)      Result Value Range   Hemoglobin A1C 6.9         Assessment & Plan:  Impression #1 type 2 diabetes control good. #2 hypertension apparent good control blood pressure improved on repeat 134/82. #3 allergic rhinitis discussed. #4 history of hyperlipidemia. Discussed. Plan diet exercise discussed.  Appropriate

## 2013-04-11 NOTE — Patient Instructions (Signed)
Please take your medicines faithfully, and keep exercising and dieting.

## 2013-04-13 DIAGNOSIS — E119 Type 2 diabetes mellitus without complications: Secondary | ICD-10-CM | POA: Insufficient documentation

## 2013-07-09 ENCOUNTER — Telehealth: Payer: Self-pay | Admitting: Family Medicine

## 2013-07-09 ENCOUNTER — Other Ambulatory Visit: Payer: Self-pay | Admitting: *Deleted

## 2013-07-09 DIAGNOSIS — E785 Hyperlipidemia, unspecified: Secondary | ICD-10-CM

## 2013-07-09 DIAGNOSIS — R7301 Impaired fasting glucose: Secondary | ICD-10-CM

## 2013-07-09 DIAGNOSIS — Z125 Encounter for screening for malignant neoplasm of prostate: Secondary | ICD-10-CM

## 2013-07-09 DIAGNOSIS — Z79899 Other long term (current) drug therapy: Secondary | ICD-10-CM

## 2013-07-09 NOTE — Telephone Encounter (Signed)
Lip liv m7 psa a1c

## 2013-07-09 NOTE — Telephone Encounter (Signed)
bloodwork papers ready for pickup. Pt notified on his voicemail

## 2013-07-09 NOTE — Telephone Encounter (Signed)
Pt needs BW papers for appt on 7/21

## 2013-07-10 LAB — HEMOGLOBIN A1C
Hgb A1c MFr Bld: 6.6 % — ABNORMAL HIGH (ref <5.7)
Mean Plasma Glucose: 143 mg/dL — ABNORMAL HIGH (ref <117)

## 2013-07-10 LAB — BASIC METABOLIC PANEL
BUN: 12 mg/dL (ref 6–23)
CO2: 28 mEq/L (ref 19–32)
Calcium: 9.5 mg/dL (ref 8.4–10.5)
Chloride: 102 mEq/L (ref 96–112)
Creat: 0.95 mg/dL (ref 0.50–1.35)
Glucose, Bld: 109 mg/dL — ABNORMAL HIGH (ref 70–99)
Potassium: 4.2 mEq/L (ref 3.5–5.3)
Sodium: 138 mEq/L (ref 135–145)

## 2013-07-10 LAB — HEPATIC FUNCTION PANEL
ALT: 36 U/L (ref 0–53)
AST: 37 U/L (ref 0–37)
Albumin: 4.1 g/dL (ref 3.5–5.2)
Alkaline Phosphatase: 83 U/L (ref 39–117)
Bilirubin, Direct: 0.1 mg/dL (ref 0.0–0.3)
Indirect Bilirubin: 0.2 mg/dL (ref 0.0–0.9)
Total Bilirubin: 0.3 mg/dL (ref 0.3–1.2)
Total Protein: 7.9 g/dL (ref 6.0–8.3)

## 2013-07-10 LAB — LIPID PANEL
Cholesterol: 190 mg/dL (ref 0–200)
HDL: 29 mg/dL — ABNORMAL LOW (ref >39)
LDL Cholesterol: 118 mg/dL — ABNORMAL HIGH (ref 0–99)
Total CHOL/HDL Ratio: 6.6 Ratio
Triglycerides: 216 mg/dL — ABNORMAL HIGH (ref <150)
VLDL: 43 mg/dL — ABNORMAL HIGH (ref 0–40)

## 2013-07-11 LAB — PSA: PSA: 1.24 ng/mL (ref <=4.00)

## 2013-07-14 ENCOUNTER — Encounter: Payer: Self-pay | Admitting: Family Medicine

## 2013-07-14 ENCOUNTER — Ambulatory Visit (INDEPENDENT_AMBULATORY_CARE_PROVIDER_SITE_OTHER): Payer: BC Managed Care – PPO | Admitting: Family Medicine

## 2013-07-14 VITALS — BP 148/90 | HR 70 | Wt 311.0 lb

## 2013-07-14 DIAGNOSIS — E785 Hyperlipidemia, unspecified: Secondary | ICD-10-CM

## 2013-07-14 DIAGNOSIS — E119 Type 2 diabetes mellitus without complications: Secondary | ICD-10-CM

## 2013-07-14 DIAGNOSIS — I10 Essential (primary) hypertension: Secondary | ICD-10-CM

## 2013-07-14 LAB — POCT GLYCOSYLATED HEMOGLOBIN (HGB A1C): Hemoglobin A1C: 6.8

## 2013-07-14 NOTE — Progress Notes (Signed)
  Subjective:    Patient ID: Caleb Ballard, male    DOB: 1966/11/25, 47 y.o.   MRN: [id number]  Diabetes He has type 2 diabetes mellitus. Pertinent negatives for hypoglycemia include no confusion. Pertinent negatives for diabetes include no blurred vision, no chest pain and no fatigue. Symptoms are stable. Pertinent negatives for diabetic complications include no autonomic neuropathy or CVA. Risk factors for coronary artery disease include hypertension, dyslipidemia and diabetes mellitus. His breakfast blood glucose is taken between 7-8 am. His breakfast blood glucose range is generally 90-110 mg/dl.   Patient also states compliant with his blood pressure medication. Trying to watch salt in his diet. Walking some.  As far as lipids he admits he has to much fat in his diet. Results for orders placed in visit on 07/14/13  POCT GLYCOSYLATED HEMOGLOBIN (HGB A1C)      Result Value Range   Hemoglobin A1C 6.8       Review of Systems  Constitutional: Negative for fatigue.  Eyes: Negative for blurred vision.  Cardiovascular: Negative for chest pain.  Psychiatric/Behavioral: Negative for confusion.   no chest pain no nausea no diaphoresis ROS otherwise negative     Objective:   Physical Exam  Alert no acute distress. HEENT normal. Lungs clear. Heart regular rate rhythm. Blood pressure good on. Feet sensation intact bilaterally pulses good no significant edema      Assessment & Plan:  Impression 1 type 2 diabetes good control. #2 hypertension good control. #3 hyperlipidemia discussed. Plan patient declines full physical today. Recheck in 6 months for that. Diet exercise discussed. Maintain same meds. Work hard on the sugar. See an eye doctor yearly. WSL

## 2013-07-14 NOTE — Patient Instructions (Signed)
Please cut down on the fried foods!. Stay with your exercise. See eye doctor yearly

## 2014-02-04 ENCOUNTER — Ambulatory Visit (INDEPENDENT_AMBULATORY_CARE_PROVIDER_SITE_OTHER): Payer: BC Managed Care – PPO | Admitting: Family Medicine

## 2014-02-04 ENCOUNTER — Encounter: Payer: Self-pay | Admitting: Family Medicine

## 2014-02-04 VITALS — BP 136/82 | Ht 73.0 in | Wt 311.6 lb

## 2014-02-04 DIAGNOSIS — R6889 Other general symptoms and signs: Secondary | ICD-10-CM

## 2014-02-04 DIAGNOSIS — Z0001 Encounter for general adult medical examination with abnormal findings: Secondary | ICD-10-CM

## 2014-02-04 DIAGNOSIS — Z125 Encounter for screening for malignant neoplasm of prostate: Secondary | ICD-10-CM

## 2014-02-04 DIAGNOSIS — E119 Type 2 diabetes mellitus without complications: Secondary | ICD-10-CM

## 2014-02-04 DIAGNOSIS — Z79899 Other long term (current) drug therapy: Secondary | ICD-10-CM

## 2014-02-04 DIAGNOSIS — I10 Essential (primary) hypertension: Secondary | ICD-10-CM

## 2014-02-04 DIAGNOSIS — Z0000 Encounter for general adult medical examination without abnormal findings: Secondary | ICD-10-CM

## 2014-02-04 DIAGNOSIS — E669 Obesity, unspecified: Secondary | ICD-10-CM

## 2014-02-04 DIAGNOSIS — E782 Mixed hyperlipidemia: Secondary | ICD-10-CM

## 2014-02-04 DIAGNOSIS — K219 Gastro-esophageal reflux disease without esophagitis: Secondary | ICD-10-CM

## 2014-02-04 MED ORDER — BENAZEPRIL-HYDROCHLOROTHIAZIDE 20-25 MG PO TABS: 1.0000 | ORAL_TABLET | Freq: Every day | ORAL | Status: DC

## 2014-02-04 MED ORDER — DILTIAZEM HCL ER BEADS 420 MG PO CP24: 420.0000 mg | ORAL_CAPSULE | Freq: Every day | ORAL | Status: DC

## 2014-02-04 NOTE — Progress Notes (Signed)
   Subjective:    Patient ID: Caleb Ballard, male    DOB: October 20, 1966, 48 y.o.   MRN: [id number]  HPI  Patient has unfortunately stopped checking his sugars.  Admits to only fair compliance with diet. Unfortunately back to sugar T. and other drinks with sugar in them.  Somewhat frustrated by ongoing weight gain.  Claims compliance with all medication.  Working overtime and significant hours with his job.  No family history of prostate or colon cancer.  Claims compliance with all medications.  See problem list for primary medical history.  Review of Systems  Constitutional: Negative for fever, activity change and appetite change.  HENT: Negative for congestion and rhinorrhea.   Eyes: Negative for discharge.  Respiratory: Negative for cough and wheezing.   Cardiovascular: Negative for chest pain.  Gastrointestinal: Negative for vomiting, abdominal pain and blood in stool.  Genitourinary: Negative for frequency and difficulty urinating.  Musculoskeletal: Negative for neck pain.  Skin: Negative for rash.  Allergic/Immunologic: Negative for environmental allergies and food allergies.  Neurological: Negative for weakness and headaches.  Psychiatric/Behavioral: Negative for agitation.  All other systems reviewed and are negative.       Objective:   Physical Exam  Vitals reviewed. Constitutional: He appears well-developed and well-nourished.  Significant obesity present  HENT:  Head: Normocephalic and atraumatic.  Right Ear: External ear normal.  Left Ear: External ear normal.  Nose: Nose normal.  Mouth/Throat: Oropharynx is clear and moist.  Eyes: EOM are normal. Pupils are equal, round, and reactive to light.  Neck: Normal range of motion. Neck supple. No thyromegaly present.  Cardiovascular: Normal rate, regular rhythm and normal heart sounds.   No murmur heard. Pulmonary/Chest: Effort normal and breath sounds normal. No respiratory distress. He has no wheezes.    Abdominal: Soft. Bowel sounds are normal. He exhibits no distension and no mass. There is no tenderness.  Genitourinary: Penis normal.  Musculoskeletal: Normal range of motion. He exhibits no edema.  Lymphadenopathy:    He has no cervical adenopathy.  Neurological: He is alert. He exhibits normal muscle tone.  Skin: Skin is warm and dry. No erythema.  Psychiatric: He has a normal mood and affect. His behavior is normal. Judgment normal.          Assessment & Plan:  Impression 1 wellness exam #2 type 2 diabetes recently stopped checking sugars. Encouraged to check them again. #3 hypertension good control. #4 hyperlipidemia status uncertain. #5 morbid obesity discussed and refill medications. Appropriate blood work. Diet exercise discussed and strongly encourage. WSL

## 2014-02-16 LAB — BASIC METABOLIC PANEL
BUN: 11 mg/dL (ref 6–23)
CO2: 27 mEq/L (ref 19–32)
Calcium: 8.8 mg/dL (ref 8.4–10.5)
Chloride: 101 mEq/L (ref 96–112)
Creat: 0.87 mg/dL (ref 0.50–1.35)
Glucose, Bld: 153 mg/dL — ABNORMAL HIGH (ref 70–99)
Potassium: 4.5 mEq/L (ref 3.5–5.3)
Sodium: 137 mEq/L (ref 135–145)

## 2014-02-16 LAB — HEPATIC FUNCTION PANEL
ALT: 31 U/L (ref 0–53)
AST: 30 U/L (ref 0–37)
Albumin: 4 g/dL (ref 3.5–5.2)
Alkaline Phosphatase: 85 U/L (ref 39–117)
Bilirubin, Direct: 0.1 mg/dL (ref 0.0–0.3)
Indirect Bilirubin: 0.2 mg/dL (ref 0.2–1.2)
Total Bilirubin: 0.3 mg/dL (ref 0.2–1.2)
Total Protein: 7.4 g/dL (ref 6.0–8.3)

## 2014-02-16 LAB — LIPID PANEL
Cholesterol: 188 mg/dL (ref 0–200)
HDL: 30 mg/dL — ABNORMAL LOW (ref >39)
LDL Cholesterol: 114 mg/dL — ABNORMAL HIGH (ref 0–99)
Total CHOL/HDL Ratio: 6.3 Ratio
Triglycerides: 218 mg/dL — ABNORMAL HIGH (ref <150)
VLDL: 44 mg/dL — ABNORMAL HIGH (ref 0–40)

## 2014-02-16 LAB — PSA: PSA: 1.14 ng/mL (ref <=4.00)

## 2014-02-16 LAB — HEMOGLOBIN A1C
Hgb A1c MFr Bld: 7.6 % — ABNORMAL HIGH (ref <5.7)
Mean Plasma Glucose: 171 mg/dL — ABNORMAL HIGH (ref <117)

## 2014-02-22 ENCOUNTER — Encounter: Payer: Self-pay | Admitting: Family Medicine

## 2014-08-05 ENCOUNTER — Ambulatory Visit (INDEPENDENT_AMBULATORY_CARE_PROVIDER_SITE_OTHER): Payer: BC Managed Care – PPO | Admitting: Family Medicine

## 2014-08-05 ENCOUNTER — Encounter: Payer: Self-pay | Admitting: Family Medicine

## 2014-08-05 VITALS — BP 150/90 | Ht 73.0 in | Wt 307.0 lb

## 2014-08-05 DIAGNOSIS — I10 Essential (primary) hypertension: Secondary | ICD-10-CM

## 2014-08-05 DIAGNOSIS — E785 Hyperlipidemia, unspecified: Secondary | ICD-10-CM

## 2014-08-05 DIAGNOSIS — E119 Type 2 diabetes mellitus without complications: Secondary | ICD-10-CM

## 2014-08-05 LAB — POCT GLYCOSYLATED HEMOGLOBIN (HGB A1C): Hemoglobin A1C: 7.1

## 2014-08-05 NOTE — Progress Notes (Signed)
   Subjective:    Patient ID: Caleb Ballard, male    DOB: 02-01-1966, 48 y.o.   MRN: [id number]  Diabetes He presents for his follow-up diabetic visit. He has type 2 diabetes mellitus. His disease course has been stable. There are no hypoglycemic associated symptoms. There are no diabetic associated symptoms. Symptoms are stable. When asked about current treatments, none were reported. When asked about meal planning, he reported none. He rarely participates in exercise. Frequency home blood tests: 1 x per month. His overall blood glucose range is 110-130 mg/dl. He does not see a podiatrist.Eye exam is current.   Keeps up on eye appts.s  Sticking with bp meds, no obv se's. Watching salt in the diet. Does not miss any medications generally.  History of hyperlipidemia. Numbers reviewed. Suboptimal in. Patient admits to off-and-on compliance with diet.     Review of Systems No headache no chest pain no abdominal pain no polyuria no polydipsia and no polyphagia no loss of consciousness ROS otherwise negative    Objective:   Physical Exam Alert no apparent distress. H EENT within normal limits. Lungs clear. Heart regular rate and rhythm. Ankles without edema.  Blood work reviewed more in depth.       Assessment & Plan:  Impression 1 type 2 diabetes control is improving. #2 hypertension blood pressure improved on repeat continue same treatment. #3 hyperlipidemia discussed. Plan diet exercise discussed. If A1c any worse at next visit we'll increase. His lipid panel and he worse at next visit will add a statin. Rationale discussed. Recheck as scheduled in 6 months. WSL

## 2014-12-08 ENCOUNTER — Other Ambulatory Visit: Payer: Self-pay | Admitting: Family Medicine

## 2015-02-03 ENCOUNTER — Encounter: Payer: Self-pay | Admitting: Family Medicine

## 2015-02-03 ENCOUNTER — Ambulatory Visit (INDEPENDENT_AMBULATORY_CARE_PROVIDER_SITE_OTHER): Payer: 59 | Admitting: Family Medicine

## 2015-02-03 VITALS — BP 132/90 | Ht 73.0 in | Wt 297.0 lb

## 2015-02-03 DIAGNOSIS — E119 Type 2 diabetes mellitus without complications: Secondary | ICD-10-CM

## 2015-02-03 DIAGNOSIS — I10 Essential (primary) hypertension: Secondary | ICD-10-CM

## 2015-02-03 DIAGNOSIS — Z125 Encounter for screening for malignant neoplasm of prostate: Secondary | ICD-10-CM

## 2015-02-03 DIAGNOSIS — E785 Hyperlipidemia, unspecified: Secondary | ICD-10-CM

## 2015-02-03 LAB — POCT GLYCOSYLATED HEMOGLOBIN (HGB A1C): Hemoglobin A1C: 9.9

## 2015-02-03 MED ORDER — BLOOD GLUCOSE MONITOR KIT: PACK | Status: DC

## 2015-02-03 MED ORDER — METFORMIN HCL 500 MG PO TABS: ORAL_TABLET | ORAL | Status: DC

## 2015-02-03 NOTE — Progress Notes (Signed)
   Subjective:    Patient ID: Caleb Ballard, male    DOB: October 23, 1966, 49 y.o.   MRN: [id number]  Diabetes He presents for his follow-up diabetic visit. He has type 2 diabetes mellitus. When asked about current treatments, none were reported. He is following a generally unhealthy diet. Exercise: walks alot at work. He does not see a podiatrist.Eye exam is current.  pt does not check  Blood sugar.   Had not ck'ed sugar in a while  178 but not fasting   Good amnt of walking with job.  Five or six miles 9 hr shift Patient reports no difficulty with polyuria polydipsia or polyphagia  Compliant with blood pressure medicine. Does not miss a dose.  Reports miserable self-control with diet.  Of note patient's lipids were elevated on last visit. Advised if no improvement on next check will need to consider medicine. Patient states despite this eating a lot of fatty foods.  Pt states no concerns today.  Results for orders placed or performed in visit on 02/03/15  POCT glycosylated hemoglobin (Hb A1C)  Result Value Ref Range   Hemoglobin A1C 9.9     Review of Systems No headache no chest pain no back pain abdominal pain no change in bowel habits no blood in stool    Objective:   Physical Exam  Alert vital stable blood pressure 132/80 4 repeat lungs clear heart rare rhythm H&T normal ankles without edema C diabetic foot exam      Assessment & Plan:  Impression #1 type 2 diabetes poor control discussed #2 hypertension good control discussed #3 hyperlipidemia status uncertain. #4 obesity discussed plan initiate metformin 500 twice a day for 3 days then 1000 twice a day. Start checking sugars daily. Diet exercise discussed. Appropriate blood work. Further recommendations based on blood work. Recheck in just 3 months for wellness exam plus type 2 diabetes visit. WSL

## 2015-02-11 LAB — BASIC METABOLIC PANEL
BUN/Creatinine Ratio: 11 (ref 9–20)
BUN: 9 mg/dL (ref 6–24)
CO2: 26 mmol/L (ref 18–29)
Calcium: 9 mg/dL (ref 8.7–10.2)
Chloride: 101 mmol/L (ref 97–108)
Creatinine, Ser: 0.83 mg/dL (ref 0.76–1.27)
GFR calc Af Amer: 121 mL/min/{1.73_m2} (ref >59)
GFR calc non Af Amer: 105 mL/min/{1.73_m2} (ref >59)
Glucose: 163 mg/dL — ABNORMAL HIGH (ref 65–99)
Potassium: 4.2 mmol/L (ref 3.5–5.2)
Sodium: 142 mmol/L (ref 134–144)

## 2015-02-11 LAB — HEPATIC FUNCTION PANEL
ALT: 49 IU/L — ABNORMAL HIGH (ref 0–44)
AST: 54 IU/L — ABNORMAL HIGH (ref 0–40)
Albumin: 3.9 g/dL (ref 3.5–5.5)
Alkaline Phosphatase: 98 IU/L (ref 39–117)
Bilirubin Total: 0.2 mg/dL (ref 0.0–1.2)
Bilirubin, Direct: 0.08 mg/dL (ref 0.00–0.40)
Total Protein: 7.5 g/dL (ref 6.0–8.5)

## 2015-02-11 LAB — LIPID PANEL
Chol/HDL Ratio: 6.1 ratio units — ABNORMAL HIGH (ref 0.0–5.0)
Cholesterol, Total: 184 mg/dL (ref 100–199)
HDL: 30 mg/dL — ABNORMAL LOW (ref >39)
LDL Calculated: 110 mg/dL — ABNORMAL HIGH (ref 0–99)
Triglycerides: 221 mg/dL — ABNORMAL HIGH (ref 0–149)
VLDL Cholesterol Cal: 44 mg/dL — ABNORMAL HIGH (ref 5–40)

## 2015-02-11 LAB — MICROALBUMIN, URINE: Microalbumin, Urine: 4.6 ug/mL (ref 0.0–17.0)

## 2015-02-11 LAB — PSA: PSA: 1.2 ng/mL (ref 0.0–4.0)

## 2015-02-13 ENCOUNTER — Other Ambulatory Visit: Payer: Self-pay | Admitting: Family Medicine

## 2015-02-14 ENCOUNTER — Encounter: Payer: Self-pay | Admitting: Family Medicine

## 2015-03-01 ENCOUNTER — Other Ambulatory Visit: Payer: Self-pay | Admitting: *Deleted

## 2015-05-04 ENCOUNTER — Ambulatory Visit (INDEPENDENT_AMBULATORY_CARE_PROVIDER_SITE_OTHER): Payer: 59 | Admitting: Family Medicine

## 2015-05-04 ENCOUNTER — Encounter: Payer: Self-pay | Admitting: Family Medicine

## 2015-05-04 VITALS — BP 124/84 | Ht 73.0 in | Wt 297.5 lb

## 2015-05-04 DIAGNOSIS — E119 Type 2 diabetes mellitus without complications: Secondary | ICD-10-CM

## 2015-05-04 DIAGNOSIS — Z0000 Encounter for general adult medical examination without abnormal findings: Secondary | ICD-10-CM

## 2015-05-04 LAB — POCT GLYCOSYLATED HEMOGLOBIN (HGB A1C): Hemoglobin A1C: 6.8

## 2015-05-04 NOTE — Progress Notes (Signed)
   Subjective:    Patient ID: Caleb Ballard, male    DOB: 08/13/1966, 49 y.o.   MRN: [id number]  Diabetes He presents for his follow-up diabetic visit. He has type 2 diabetes mellitus. His disease course has been stable. There are no hypoglycemic associated symptoms. Pertinent negatives for hypoglycemia include no headaches. There are no diabetic associated symptoms. Pertinent negatives for diabetes include no chest pain and no weakness. There are no hypoglycemic complications. Symptoms are stable. There are no diabetic complications. There are no known risk factors for coronary artery disease. Current diabetic treatment includes oral agent (monotherapy). He is compliant with treatment all of the time.   Patient states he has no concerns at this time.  Results for orders placed or performed in visit on 05/04/15  POCT glycosylated hemoglobin (Hb A1C)  Result Value Ref Range   Hemoglobin A1C 6.8    Walking up to six d per wk,  No fam hx of colon ca or prost ca  Patient also in for wellness exam.  Exercising very regularly.  No family history of prostate or colon cancer. Next  Does not smoke.  Review of Systems  Constitutional: Negative for fever, activity change and appetite change.  HENT: Negative for congestion and rhinorrhea.   Eyes: Negative for discharge.  Respiratory: Negative for cough and wheezing.   Cardiovascular: Negative for chest pain.  Gastrointestinal: Negative for vomiting, abdominal pain and blood in stool.  Genitourinary: Negative for frequency and difficulty urinating.  Musculoskeletal: Negative for neck pain.  Skin: Negative for rash.  Allergic/Immunologic: Negative for environmental allergies and food allergies.  Neurological: Negative for weakness and headaches.  Psychiatric/Behavioral: Negative for agitation.  All other systems reviewed and are negative.  No headache no chest pain no back pain abdominal pain no chest change in bowel habits      Objective:   Physical Exam  Constitutional: He appears well-developed and well-nourished.  HENT:  Head: Normocephalic and atraumatic.  Right Ear: External ear normal.  Left Ear: External ear normal.  Nose: Nose normal.  Mouth/Throat: Oropharynx is clear and moist.  Eyes: EOM are normal. Pupils are equal, round, and reactive to light.  Neck: Normal range of motion. Neck supple. No thyromegaly present.  Cardiovascular: Normal rate, regular rhythm and normal heart sounds.   No murmur heard. Pulmonary/Chest: Effort normal and breath sounds normal. No respiratory distress. He has no wheezes.  Abdominal: Soft. Bowel sounds are normal. He exhibits no distension and no mass. There is no tenderness.  Genitourinary: Penis normal.  Prostate exam within normal limits  Musculoskeletal: Normal range of motion. He exhibits no edema.  Lymphadenopathy:    He has no cervical adenopathy.  Neurological: He is alert. He exhibits normal muscle tone.  Skin: Skin is warm and dry. No erythema.  Psychiatric: He has a normal mood and affect. His behavior is normal. Judgment normal.  Vitals reviewed.         Assessment & Plan:  Impression wellness exam #2 type 2 diabetes clinically improved on current medication. No obvious side effects from medicine. Initial loose stools plan diet discussed exercise discussed. Maintain all medications. Recheck in 6 months. WSL

## 2015-05-15 ENCOUNTER — Other Ambulatory Visit: Payer: Self-pay | Admitting: Family Medicine

## 2015-08-29 ENCOUNTER — Other Ambulatory Visit: Payer: Self-pay | Admitting: Family Medicine

## 2015-11-01 ENCOUNTER — Ambulatory Visit (INDEPENDENT_AMBULATORY_CARE_PROVIDER_SITE_OTHER): Payer: 59 | Admitting: Family Medicine

## 2015-11-01 ENCOUNTER — Encounter: Payer: Self-pay | Admitting: Family Medicine

## 2015-11-01 VITALS — BP 148/90 | Ht 73.0 in | Wt 298.5 lb

## 2015-11-01 DIAGNOSIS — E785 Hyperlipidemia, unspecified: Secondary | ICD-10-CM

## 2015-11-01 DIAGNOSIS — E119 Type 2 diabetes mellitus without complications: Secondary | ICD-10-CM | POA: Diagnosis not present

## 2015-11-01 DIAGNOSIS — I10 Essential (primary) hypertension: Secondary | ICD-10-CM | POA: Diagnosis not present

## 2015-11-01 LAB — POCT GLYCOSYLATED HEMOGLOBIN (HGB A1C): Hemoglobin A1C: 6.7

## 2015-11-01 NOTE — Progress Notes (Signed)
   Subjective:    Patient ID: Caleb Ballard, male    DOB: 1966-04-13, 49 y.o.   MRN: [id number] Patient arrives office with several distinct concerns next   Diabetes He presents for his follow-up diabetic visit. He has type 2 diabetes mellitus. No MedicAlert identification noted. He has not had a previous visit with a dietitian. He does not see a podiatrist.Eye exam is not current.   Patient states no other concerns this visit. Hemoglobin A1c today is: 6.7  Results for orders placed or performed in visit on 11/01/15  POCT HgB A1C  Result Value Ref Range   Hemoglobin A1C 6.7    BP med faithfully, watching closely. Generally does not miss a dose. No obvious side effects. Medications reviewed today.  Has not seen an eye doctor recently for diabetes visit  Patient had hyperlipidemia in the spring. Further discuss. Patient is been so so on diet.  No new medical problems  Flu shot already given,    Review of Systems No headache no chest pain no back pain no abdominal pain no change in bowel habits ROS otherwise negative    Objective:   Physical Exam  Alert vital stable HEENT normal lungs clear heart rare rhythm ankles without edema    Assessment & Plan:  Impression and plan 1 type 2 diabetes good control meds reviewed maintain same dose and medicine #2 hypertension controlled good maintain same meds and dose diet discussed #3 hyperlipidemia long discussion held. Due to low HDL and elevated LDL patient should be on medication. Reluctant to do this pros and cons discussed plan 25 minutes spent most in discussion. Schedule wellness plus follow-up  Blood work in 6 months if not improved will initiate lipid medicine a blood pressure not improve will initiate additional medication WSL

## 2016-02-08 ENCOUNTER — Other Ambulatory Visit: Payer: Self-pay | Admitting: Family Medicine

## 2016-05-02 ENCOUNTER — Encounter: Payer: 59 | Admitting: Family Medicine

## 2016-05-04 ENCOUNTER — Ambulatory Visit (INDEPENDENT_AMBULATORY_CARE_PROVIDER_SITE_OTHER): Payer: 59 | Admitting: Family Medicine

## 2016-05-04 ENCOUNTER — Encounter: Payer: Self-pay | Admitting: Family Medicine

## 2016-05-04 VITALS — BP 138/86 | Ht 73.0 in | Wt 301.0 lb

## 2016-05-04 DIAGNOSIS — Z125 Encounter for screening for malignant neoplasm of prostate: Secondary | ICD-10-CM | POA: Diagnosis not present

## 2016-05-04 DIAGNOSIS — I10 Essential (primary) hypertension: Secondary | ICD-10-CM

## 2016-05-04 DIAGNOSIS — E785 Hyperlipidemia, unspecified: Secondary | ICD-10-CM | POA: Diagnosis not present

## 2016-05-04 DIAGNOSIS — Z0000 Encounter for general adult medical examination without abnormal findings: Secondary | ICD-10-CM | POA: Diagnosis not present

## 2016-05-04 DIAGNOSIS — E119 Type 2 diabetes mellitus without complications: Secondary | ICD-10-CM | POA: Diagnosis not present

## 2016-05-04 DIAGNOSIS — Z79899 Other long term (current) drug therapy: Secondary | ICD-10-CM

## 2016-05-04 LAB — POCT GLYCOSYLATED HEMOGLOBIN (HGB A1C): Hemoglobin A1C: 9.2

## 2016-05-04 MED ORDER — GLIPIZIDE 5 MG PO TABS: 5.0000 mg | ORAL_TABLET | Freq: Two times a day (BID) | ORAL | Status: DC

## 2016-05-04 NOTE — Progress Notes (Signed)
   Subjective:    Patient ID: Caleb Ballard, male    DOB: 1966-01-19, 50 y.o.   MRN: [id number]  HPI The patient comes in today for a wellness visit.    A review of their health history was completed.  A review of medications was also completed.  Any needed refills; none  Eating habits: not healthy  Falls/  MVA accidents in past few months: none  Regular exercise: walks 5 miles a day  Specialist pt sees on regular basis: none  Preventative health issues were discussed.   Additional concerns: none  Results for orders placed or performed in visit on 05/04/16  POCT glycosylated hemoglobin (Hb A1C)  Result Value Ref Range   Hemoglobin A1C 9.2    Morn sugars  Glu in the morn not good, pt notes poor dietary compliance  And not exercising  Had transient depressionstates overall feeling better now  Was out of his diabetes medication for while. Now taking once again. No longer checking sugars unfortunately. Reports diet isfair at this time  Claims compliance with blood pressure medication. Does not miss a dose. Has cut down salt intake  Review of Systems  Constitutional: Negative for fever, activity change and appetite change.  HENT: Negative for congestion and rhinorrhea.   Eyes: Negative for discharge.  Respiratory: Negative for cough and wheezing.   Cardiovascular: Negative for chest pain.  Gastrointestinal: Negative for vomiting, abdominal pain and blood in stool.  Genitourinary: Negative for frequency and difficulty urinating.  Musculoskeletal: Negative for neck pain.  Skin: Negative for rash.  Allergic/Immunologic: Negative for environmental allergies and food allergies.  Neurological: Negative for weakness and headaches.  Psychiatric/Behavioral: Negative for agitation.  All other systems reviewed and are negative.      Objective:   Physical Exam  Constitutional: He appears well-developed and well-nourished.  HENT:  Head: Normocephalic and atraumatic.    Right Ear: External ear normal.  Left Ear: External ear normal.  Nose: Nose normal.  Mouth/Throat: Oropharynx is clear and moist.  Eyes: EOM are normal. Pupils are equal, round, and reactive to light.  Neck: Normal range of motion. Neck supple. No thyromegaly present.  Cardiovascular: Normal rate, regular rhythm and normal heart sounds.   No murmur heard. Pulmonary/Chest: Effort normal and breath sounds normal. No respiratory distress. He has no wheezes.  Abdominal: Soft. Bowel sounds are normal. He exhibits no distension and no mass. There is no tenderness.  Genitourinary: Penis normal.  Musculoskeletal: Normal range of motion. He exhibits no edema.  Lymphadenopathy:    He has no cervical adenopathy.  Neurological: He is alert. He exhibits normal muscle tone.  Skin: Skin is warm and dry. No erythema.  Psychiatric: He has a normal mood and affect. His behavior is normal. Judgment normal.  Vitals reviewed.         Assessment & Plan:  Impression 1 well adult exam #2 type 2 diabetes poor control with element of noncompliance #3 hypertension decent control #4 morbid obesity discussedplan add Glucotrol rationale discussed maintain other blood pressure meds diet exercise discussed. Appropriate blood work.importance of diet and exercise discussed

## 2016-05-04 NOTE — Progress Notes (Deleted)
   Subjective:    Patient ID: Caleb Ballard, male    DOB: 06-08-1966, 50 y.o.   MRN: [id number]  HPI The patient comes in today for a wellness visit.    A review of their health history was completed.  A review of medications was also completed.  Any needed refills; none  Eating habits: not healthy  Falls/  MVA accidents in past few months: none  Regular exercise: walks 5 miles a day  Specialist pt sees on regular basis: none  Preventative health issues were discussed.   Additional concerns: none  Results for orders placed or performed in visit on 11/01/15  POCT HgB A1C  Result Value Ref Range   Hemoglobin A1C 6.7        Review of Systems     Objective:   Physical Exam        Assessment & Plan:

## 2016-05-05 LAB — MICROALBUMIN / CREATININE URINE RATIO
Creatinine, Urine: 98.8 mg/dL
MICROALB/CREAT RATIO: 34.3 mg/g creat — ABNORMAL HIGH (ref 0.0–30.0)
Microalbumin, Urine: 33.9 ug/mL

## 2016-05-05 LAB — BASIC METABOLIC PANEL
BUN/Creatinine Ratio: 10 (ref 9–20)
BUN: 9 mg/dL (ref 6–24)
CO2: 23 mmol/L (ref 18–29)
Calcium: 9.3 mg/dL (ref 8.7–10.2)
Chloride: 97 mmol/L (ref 96–106)
Creatinine, Ser: 0.92 mg/dL (ref 0.76–1.27)
GFR calc Af Amer: 113 mL/min/{1.73_m2} (ref >59)
GFR calc non Af Amer: 98 mL/min/{1.73_m2} (ref >59)
Glucose: 170 mg/dL — ABNORMAL HIGH (ref 65–99)
Potassium: 3.9 mmol/L (ref 3.5–5.2)
Sodium: 139 mmol/L (ref 134–144)

## 2016-05-05 LAB — LIPID PANEL
Chol/HDL Ratio: 5.8 ratio units — ABNORMAL HIGH (ref 0.0–5.0)
Cholesterol, Total: 191 mg/dL (ref 100–199)
HDL: 33 mg/dL — ABNORMAL LOW (ref >39)
LDL Calculated: 116 mg/dL — ABNORMAL HIGH (ref 0–99)
Triglycerides: 210 mg/dL — ABNORMAL HIGH (ref 0–149)
VLDL Cholesterol Cal: 42 mg/dL — ABNORMAL HIGH (ref 5–40)

## 2016-05-05 LAB — HEPATIC FUNCTION PANEL
ALT: 38 IU/L (ref 0–44)
AST: 44 IU/L — ABNORMAL HIGH (ref 0–40)
Albumin: 4.2 g/dL (ref 3.5–5.5)
Alkaline Phosphatase: 88 IU/L (ref 39–117)
Bilirubin Total: 0.2 mg/dL (ref 0.0–1.2)
Bilirubin, Direct: 0.07 mg/dL (ref 0.00–0.40)
Total Protein: 8 g/dL (ref 6.0–8.5)

## 2016-05-05 LAB — PSA: Prostate Specific Ag, Serum: 1.8 ng/mL (ref 0.0–4.0)

## 2016-05-10 ENCOUNTER — Encounter: Payer: Self-pay | Admitting: Family Medicine

## 2016-08-07 ENCOUNTER — Ambulatory Visit (INDEPENDENT_AMBULATORY_CARE_PROVIDER_SITE_OTHER): Payer: 59 | Admitting: Family Medicine

## 2016-08-07 ENCOUNTER — Encounter: Payer: Self-pay | Admitting: Family Medicine

## 2016-08-07 VITALS — BP 150/74 | Ht 73.0 in | Wt 298.1 lb

## 2016-08-07 DIAGNOSIS — E119 Type 2 diabetes mellitus without complications: Secondary | ICD-10-CM

## 2016-08-07 DIAGNOSIS — I10 Essential (primary) hypertension: Secondary | ICD-10-CM

## 2016-08-07 LAB — POCT GLYCOSYLATED HEMOGLOBIN (HGB A1C): Hemoglobin A1C: 6.6

## 2016-08-07 MED ORDER — GLIPIZIDE 5 MG PO TABS: 5.0000 mg | ORAL_TABLET | Freq: Two times a day (BID) | ORAL | 5 refills | Status: DC

## 2016-08-07 MED ORDER — DILTIAZEM HCL ER BEADS 420 MG PO CP24: 420.0000 mg | ORAL_CAPSULE | Freq: Every day | ORAL | 5 refills | Status: DC

## 2016-08-07 MED ORDER — METFORMIN HCL 500 MG PO TABS: ORAL_TABLET | ORAL | 5 refills | Status: DC

## 2016-08-07 MED ORDER — BENAZEPRIL-HYDROCHLOROTHIAZIDE 20-25 MG PO TABS: 1.0000 | ORAL_TABLET | Freq: Every day | ORAL | 5 refills | Status: DC

## 2016-08-07 NOTE — Progress Notes (Signed)
   Subjective:    Patient ID: Caleb Ballard, male    DOB: [birth year], 50 y.o.   MRN: [id number]  Diabetes  He presents for his follow-up diabetic visit. He has type 2 diabetes mellitus. He has not had a previous visit with a dietitian. He does not see a podiatrist.Eye exam is not current.   Patient states no other concerns this visit.  Results for orders placed or performed in visit on 08/07/16  POCT HgB A1C  Result Value Ref Range   Hemoglobin A1C 6.6    No low sugar spells, but not sure if having them  Walking every morn mon thru fri, walks for an hour, four miles   Review of Systems No headache, no major weight loss or weight gain, no chest pain no back pain abdominal pain no change in bowel habits complete ROS otherwise negative     Objective:   Physical Exam  Alert vitals stable, NAD. Blood pressure good on repeat. HEENT normal. Lungs clear. Heart regular rate and rhythm.       Assessment & Plan:  Impression type 2 diabetes excellent control discussed maintain same #2 hypertension good control discussed maintain same plan follow-up as scheduled diet exercise discussed medications refilled

## 2016-08-15 ENCOUNTER — Ambulatory Visit (INDEPENDENT_AMBULATORY_CARE_PROVIDER_SITE_OTHER): Payer: 59 | Admitting: Family Medicine

## 2016-08-15 ENCOUNTER — Encounter: Payer: Self-pay | Admitting: Family Medicine

## 2016-08-15 VITALS — BP 138/90 | Ht 73.0 in | Wt 298.0 lb

## 2016-08-15 DIAGNOSIS — M5431 Sciatica, right side: Secondary | ICD-10-CM

## 2016-08-15 MED ORDER — HYDROCODONE-ACETAMINOPHEN 10-325 MG PO TABS: ORAL_TABLET | ORAL | 0 refills | Status: DC

## 2016-08-15 MED ORDER — ETODOLAC 400 MG PO TABS: 400.0000 mg | ORAL_TABLET | Freq: Two times a day (BID) | ORAL | 1 refills | Status: DC

## 2016-08-15 NOTE — Progress Notes (Signed)
   Subjective:    Patient ID: Caleb Ballard, male    DOB: April 21, 1966, 50 y.o.   MRN: [id number]  Back Pain  This is a new problem. Episode onset: 2 days ago. The problem occurs constantly. The pain is present in the gluteal. The pain radiates to the left thigh. The pain is at a severity of 10/10. The pain is severe. The pain is the same all the time. The symptoms are aggravated by standing and position. Treatments tried: bio freeze, advil, heat. The treatment provided no relief.      Review of Systems  Constitutional: Negative for fatigue.  HENT: Negative for congestion.   Respiratory: Negative for cough.   Musculoskeletal: Positive for back pain.  Neurological: Negative for dizziness.       Objective:   Physical Exam  Constitutional: He appears well-nourished. No distress.  Cardiovascular: Normal rate, regular rhythm and normal heart sounds.   No murmur heard. Pulmonary/Chest: Effort normal and breath sounds normal. No respiratory distress.  Musculoskeletal: He exhibits no edema.  Lymphadenopathy:    He has no cervical adenopathy.  Neurological: He is alert.  Psychiatric: His behavior is normal.  Vitals reviewed.  This patient can walk on his heels without difficulty He can walk on his toes He does have difficulty with rotation and flexion forward and backwards. Contralateral positive straight leg raise test is positive. No weakness detected reflexes are good in the patella       Assessment & Plan:  Significant left lower back hip discomfort with sciatica to the side of his thigh in the front of his thigh  Stretching exercises were shown in detail  Anti-inflammatory on a regular basis Pain medication only when necessary cautioned drowsiness Follow-up within 6 weeks. If not dramatically better within 6 weeks consider physical therapy possibly MRI injections. Patient was told that 90% will get better within 8 week's time follow-up sooner problems

## 2016-08-21 ENCOUNTER — Telehealth: Payer: Self-pay | Admitting: Family Medicine

## 2016-08-21 NOTE — Telephone Encounter (Signed)
Patient notified that this is true sciatica and usually chiropractic care does not speed up the eventual fading of this pain which fades with time, if hed like to see chiro he can--won't hurt, still a chance this could persisit with or without chiro care and then will need further work up. Patient verbalized understanding

## 2016-08-21 NOTE — Telephone Encounter (Signed)
Pt called wanting to know if it would be a good idea to see a chiropractor for his back pain. Please advise.

## 2016-08-21 NOTE — Telephone Encounter (Signed)
Sorry this is true sciativca and usually chiroprsactic care does not speed up the eventual fading of this pain which fades with time, if hed like to see chiro he can--won't hurt, still a chance this could persisit with or without chiro care and yhen will need further w u

## 2016-08-24 ENCOUNTER — Telehealth: Payer: Self-pay | Admitting: Family Medicine

## 2016-08-24 MED ORDER — HYDROCODONE-ACETAMINOPHEN 10-325 MG PO TABS: ORAL_TABLET | ORAL | 0 refills | Status: DC

## 2016-08-24 NOTE — Telephone Encounter (Signed)
Patient got #30 on 08/15/16

## 2016-08-24 NOTE — Telephone Encounter (Signed)
Ok times one 

## 2016-08-24 NOTE — Telephone Encounter (Signed)
Prescription upfront for pick up. Patient notified. 

## 2016-08-24 NOTE — Telephone Encounter (Signed)
Pt is needing a refill on his HYDROcodone-acetaminophen (NORCO) 10-325 MG tablet

## 2016-09-08 ENCOUNTER — Ambulatory Visit (INDEPENDENT_AMBULATORY_CARE_PROVIDER_SITE_OTHER): Payer: 59 | Admitting: Family Medicine

## 2016-09-08 ENCOUNTER — Encounter: Payer: Self-pay | Admitting: Family Medicine

## 2016-09-08 VITALS — BP 144/94 | Ht 73.0 in | Wt 292.4 lb

## 2016-09-08 DIAGNOSIS — M5431 Sciatica, right side: Secondary | ICD-10-CM | POA: Diagnosis not present

## 2016-09-08 MED ORDER — HYDROCODONE-ACETAMINOPHEN 10-325 MG PO TABS: ORAL_TABLET | ORAL | 0 refills | Status: DC

## 2016-09-08 NOTE — Progress Notes (Signed)
   Subjective:    Patient ID: Caleb Ballard, male    DOB: October 21, 1966, 50 y.o.   MRN: [id number]  Back Pain  This is a new problem. The current episode started 1 to 4 weeks ago. The pain is present in the lumbar spine. Radiates to: left leg. He has tried chiropractic manipulation and analgesics for the symptoms.   Pain substantially better  Pain was Extremely severe at first now much improved. Still occasional flares that are extremely painful but overall significantly better   Pain much better, had to use a walker initially  This wk has not and feeling bretter   Patient states no other concerns this visit.  Review of Systems  Musculoskeletal: Positive for back pain.       Objective:   Physical Exam Alert vital stable blood pressure good on repeat HEENT normal lungs clear heart regular rhythm. Plus minus straight leg raise on left no sciatic notch tenderness       Assessment & Plan:  Impression resolving sciatica plan may still had occasional hydrocodone at night. Not to use for long-term. Symptom care discussed WSL

## 2017-02-07 ENCOUNTER — Ambulatory Visit: Payer: 59 | Admitting: Family Medicine

## 2017-02-09 ENCOUNTER — Ambulatory Visit (INDEPENDENT_AMBULATORY_CARE_PROVIDER_SITE_OTHER): Payer: Managed Care, Other (non HMO) | Admitting: Family Medicine

## 2017-02-09 ENCOUNTER — Encounter: Payer: Self-pay | Admitting: Family Medicine

## 2017-02-09 VITALS — BP 142/90 | Ht 73.0 in | Wt 298.0 lb

## 2017-02-09 DIAGNOSIS — E119 Type 2 diabetes mellitus without complications: Secondary | ICD-10-CM

## 2017-02-09 DIAGNOSIS — I10 Essential (primary) hypertension: Secondary | ICD-10-CM

## 2017-02-09 LAB — POCT GLYCOSYLATED HEMOGLOBIN (HGB A1C): Hemoglobin A1C: 7.2

## 2017-02-09 MED ORDER — METFORMIN HCL 500 MG PO TABS: ORAL_TABLET | ORAL | 5 refills | Status: DC

## 2017-02-09 MED ORDER — DILTIAZEM HCL ER BEADS 420 MG PO CP24: 420.0000 mg | ORAL_CAPSULE | Freq: Every day | ORAL | 5 refills | Status: DC

## 2017-02-09 MED ORDER — GLIPIZIDE 5 MG PO TABS: 5.0000 mg | ORAL_TABLET | Freq: Two times a day (BID) | ORAL | 5 refills | Status: DC

## 2017-02-09 MED ORDER — BENAZEPRIL-HYDROCHLOROTHIAZIDE 20-25 MG PO TABS: 1.0000 | ORAL_TABLET | Freq: Every day | ORAL | 5 refills | Status: DC

## 2017-02-09 NOTE — Progress Notes (Signed)
   Subjective:    Patient ID: Caleb Ballard, male    DOB: 11-10-1966, 51 y.o.   MRN: [id number]  Diabetes  He presents for his follow-up diabetic visit. He has type 2 diabetes mellitus. Current diabetic treatment includes oral agent (dual therapy). He is compliant with treatment all of the time. Exercise: walks 5 days a week. He does not see a podiatrist.Eye exam is not current.   A1C today 7.2  Pt states no concerns today.   Results for orders placed or performed in visit on 02/09/17  POCT glycosylated hemoglobin (Hb A1C)  Result Value Ref Range   Hemoglobin A1C 7.2    Patient claims compliance with diabetes medication. No obvious side effects. Reports no substantial low sugar spells. Most numbers are generally in good range when checked fasting. Generally does not miss a dose of medication. Watching diabetic diet closely  Blood pressure medicine and blood pressure levels reviewed today with patient. Compliant with blood pressure medicine. States does not miss a dose. No obvious side effects. Blood pressure generally good when checked elsewhere. Watching salt intake.  120 rto 130  Good sticking with meds during the week, not so good with the weekend Review of Systems No headache, no major weight loss or weight gain, no chest pain no back pain abdominal pain no change in bowel habits complete ROS otherwise negative     Objective:   Physical Exam  Alert vitals stable, NAD. Blood pressure good on repeat. HEENT normal. Lungs clear. Heart regular rate and rhythm. Ankles without edema sensation intact      Assessment & Plan:  Impression type 2 diabetes discussed good control plan diet exercise discussed maintain same medications. Hypertension good control discussed maintain same meds follow-up as scheduled for both wellness and chronic visit in 6 months

## 2017-08-07 ENCOUNTER — Ambulatory Visit (INDEPENDENT_AMBULATORY_CARE_PROVIDER_SITE_OTHER): Payer: Managed Care, Other (non HMO) | Admitting: Family Medicine

## 2017-08-07 ENCOUNTER — Encounter: Payer: Self-pay | Admitting: Family Medicine

## 2017-08-07 VITALS — BP 132/76 | Ht 73.0 in | Wt 301.4 lb

## 2017-08-07 DIAGNOSIS — Z0000 Encounter for general adult medical examination without abnormal findings: Secondary | ICD-10-CM | POA: Diagnosis not present

## 2017-08-07 DIAGNOSIS — I10 Essential (primary) hypertension: Secondary | ICD-10-CM

## 2017-08-07 DIAGNOSIS — E119 Type 2 diabetes mellitus without complications: Secondary | ICD-10-CM | POA: Diagnosis not present

## 2017-08-07 LAB — POCT GLYCOSYLATED HEMOGLOBIN (HGB A1C): Hemoglobin A1C: 7.8

## 2017-08-07 MED ORDER — GLIPIZIDE 5 MG PO TABS: ORAL_TABLET | ORAL | 5 refills | Status: DC

## 2017-08-07 MED ORDER — BENAZEPRIL-HYDROCHLOROTHIAZIDE 20-25 MG PO TABS: 1.0000 | ORAL_TABLET | Freq: Every day | ORAL | 5 refills | Status: DC

## 2017-08-07 MED ORDER — DILTIAZEM HCL ER BEADS 420 MG PO CP24: 420.0000 mg | ORAL_CAPSULE | Freq: Every day | ORAL | 5 refills | Status: DC

## 2017-08-07 MED ORDER — METFORMIN HCL 500 MG PO TABS: ORAL_TABLET | ORAL | 5 refills | Status: DC

## 2017-08-07 NOTE — Patient Instructions (Signed)
Please work harder on diet and exrcise  Please go see an eye doctor  Please take new dose of glipizide faithfully

## 2017-08-07 NOTE — Progress Notes (Signed)
   Subjective:    Patient ID: Caleb Ballard, male    DOB: [birth year], 51 y.o.   MRN: [id number]  HPI The patient comes in today for a wellness visit.    A review of their health history was completed.  A review of medications was also completed.  Any needed refills; Yes   Eating habits: Patient states eating habits are horrible. States needs to do better.   Falls/  MVA accidents in past few months: None   Regular exercise: Patient states does not exercise.   Specialist pt sees on regular basis:  None   Preventative health issues were discussed.   Additional concerns: Patient states no other concerns this visit.   Results for orders placed or performed in visit on 08/07/17  POCT HgB A1C  Result Value Ref Range   Hemoglobin A1C 7.8    Patient claims compliance with diabetes medication. No obvious side effects. Reports no substantial low sugar spells. Most numbers are generally in good range when checked fasting. Generally does not miss a dose of medication. Watching diabetic diet closely   Blood pressure medicine and blood pressure levels reviewed today with patient. Compliant with blood pressure medicine. States does not miss a dose. No obvious side effects. Blood pressure generally good when checked elsewhere. Watching salt intake.     Review of Systems  Constitutional: Negative.  Negative for activity change, appetite change and fever.  HENT: Negative for congestion and rhinorrhea.   Eyes: Negative for discharge.  Respiratory: Negative for cough and wheezing.   Cardiovascular: Negative for chest pain.  Gastrointestinal: Negative for abdominal pain, blood in stool and vomiting.  Genitourinary: Negative for difficulty urinating and frequency.  Musculoskeletal: Negative for neck pain.  Skin: Negative for rash.  Allergic/Immunologic: Negative for environmental allergies and food allergies.  Neurological: Negative for weakness and headaches.  Psychiatric/Behavioral:  Negative for agitation.  All other systems reviewed and are negative.      Objective:   Physical Exam  Constitutional: He appears well-developed and well-nourished.  Obesity present  HENT:  Head: Normocephalic and atraumatic.  Right Ear: External ear normal.  Left Ear: External ear normal.  Nose: Nose normal.  Mouth/Throat: Oropharynx is clear and moist.  Eyes: Pupils are equal, round, and reactive to light. EOM are normal.  Neck: Normal range of motion. Neck supple. No thyromegaly present.  Cardiovascular: Normal rate, regular rhythm and normal heart sounds.   No murmur heard. Pulmonary/Chest: Effort normal and breath sounds normal. No respiratory distress. He has no wheezes.  Abdominal: Soft. Bowel sounds are normal. He exhibits no distension and no mass. There is no tenderness.  Genitourinary: Penis normal.  Genitourinary Comments: Prostate exam within normal limits  Musculoskeletal: Normal range of motion. He exhibits no edema.  Lymphadenopathy:    He has no cervical adenopathy.  Neurological: He is alert. He exhibits normal muscle tone.  Skin: Skin is warm and dry. No erythema.  Psychiatric: He has a normal mood and affect. His behavior is normal. Judgment normal.  Vitals reviewed.         Assessment & Plan:  Impression 1 wellness exam. Diet discussed. Exercise discussed. Patient wishes to wait till next year to work on colonoscopy. Obesity discussed #2 type 2 diabetes suboptimum control glipizide increased encouraged to see eye specialist #3 hypertension good control discussed maintain same meds plan as noted above medications refilled diet exercise discussed blood work ordered further recommendations based results

## 2018-02-07 ENCOUNTER — Encounter: Payer: Self-pay | Admitting: Family Medicine

## 2018-02-07 ENCOUNTER — Ambulatory Visit: Payer: BLUE CROSS/BLUE SHIELD | Admitting: Family Medicine

## 2018-02-07 VITALS — BP 132/80 | Ht 73.0 in | Wt 300.0 lb

## 2018-02-07 DIAGNOSIS — Z79899 Other long term (current) drug therapy: Secondary | ICD-10-CM

## 2018-02-07 DIAGNOSIS — Z125 Encounter for screening for malignant neoplasm of prostate: Secondary | ICD-10-CM | POA: Diagnosis not present

## 2018-02-07 DIAGNOSIS — E785 Hyperlipidemia, unspecified: Secondary | ICD-10-CM

## 2018-02-07 DIAGNOSIS — I10 Essential (primary) hypertension: Secondary | ICD-10-CM | POA: Diagnosis not present

## 2018-02-07 DIAGNOSIS — E119 Type 2 diabetes mellitus without complications: Secondary | ICD-10-CM | POA: Diagnosis not present

## 2018-02-07 LAB — POCT GLYCOSYLATED HEMOGLOBIN (HGB A1C): Hemoglobin A1C: 6.5

## 2018-02-07 MED ORDER — METFORMIN HCL 500 MG PO TABS: ORAL_TABLET | ORAL | 5 refills | Status: DC

## 2018-02-07 MED ORDER — ATORVASTATIN CALCIUM 20 MG PO TABS: 20.0000 mg | ORAL_TABLET | Freq: Every day | ORAL | 5 refills | Status: DC

## 2018-02-07 MED ORDER — DILTIAZEM HCL ER BEADS 420 MG PO CP24: 420.0000 mg | ORAL_CAPSULE | Freq: Every day | ORAL | 5 refills | Status: DC

## 2018-02-07 MED ORDER — GLIPIZIDE 5 MG PO TABS: ORAL_TABLET | ORAL | 5 refills | Status: DC

## 2018-02-07 MED ORDER — BENAZEPRIL-HYDROCHLOROTHIAZIDE 20-25 MG PO TABS: 1.0000 | ORAL_TABLET | Freq: Every day | ORAL | 5 refills | Status: DC

## 2018-02-07 NOTE — Progress Notes (Signed)
   Subjective:    Patient ID: Caleb Ballard, male    DOB: 25-Jun-1966, 52 y.o.   MRN: [id number]  Diabetes  He presents for his follow-up diabetic visit. He has type 2 diabetes mellitus. Compliance with diabetes treatment: tries to take meds everyday. He never participates in exercise. Home blood sugar record trend: has not been checking blood sugar. He does not see a podiatrist.Eye exam is not current.   Results for orders placed or performed in visit on 02/07/18  POCT glycosylated hemoglobin (Hb A1C)  Result Value Ref Range   Hemoglobin A1C 6.5    Blood pressure medicine and blood pressure levels reviewed today with patient. Compliant with blood pressure medicine. States does not miss a dose. No obvious side effects. Blood pressure generally good when checked elsewhere. Watching salt intake.   Prior lipid profile reviewed, unfortunately patient did not get most recent order for blood work   Pt states no concerns today.   Review of Systems No headache, no major weight loss or weight gain, no chest pain no back pain abdominal pain no change in bowel habits complete ROS otherwise negative     Objective:   Physical Exam Alert and oriented, vitals reviewed and stable, NAD ENT-TM's and ext canals WNL bilat via otoscopic exam Soft palate, tonsils and post pharynx WNL via oropharyngeal exam Neck-symmetric, no masses; thyroid nonpalpable and nontender Pulmonary-no tachypnea or accessory muscle use; Clear without wheezes via auscultation Card--no abnrml murmurs, rhythm reg and rate WNL Carotid pulses symmetric, without bruits        Assessment & Plan:  Impression 1 type 2 diabetes excellent control discussed to maintain same  2.  Hypertension.  Also control discussed maintain same  3.  Hyperlipidemia time to start moderate dose intensity statin rationale discussed at great length patient agrees will initiate  Greater than 50% of this 25 minute face to face visit was spent in  counseling and discussion and coordination of care regarding the above diagnosis/diagnosies

## 2018-02-07 NOTE — Progress Notes (Signed)
   Subjective:    Patient ID: Caleb Ballard, male    DOB: Sep 14, 1966, 52 y.o.   MRN: [id number]  HPI    Review of Systems     Objective:   Physical Exam        Assessment & Plan:

## 2018-04-19 ENCOUNTER — Telehealth: Payer: Self-pay | Admitting: Family Medicine

## 2018-04-19 NOTE — Telephone Encounter (Signed)
Rx written and faxed pt is aware.

## 2018-04-19 NOTE — Telephone Encounter (Signed)
Patient is requesting a new Rx sent to [facility] for his test strips.  He thinks the brand is Accu-check.

## 2018-04-19 NOTE — Telephone Encounter (Signed)
Rx written awaiting signature from the Dr.

## 2018-05-10 DIAGNOSIS — Z79899 Other long term (current) drug therapy: Secondary | ICD-10-CM | POA: Diagnosis not present

## 2018-05-10 DIAGNOSIS — I10 Essential (primary) hypertension: Secondary | ICD-10-CM | POA: Diagnosis not present

## 2018-05-10 DIAGNOSIS — Z125 Encounter for screening for malignant neoplasm of prostate: Secondary | ICD-10-CM | POA: Diagnosis not present

## 2018-05-10 DIAGNOSIS — E119 Type 2 diabetes mellitus without complications: Secondary | ICD-10-CM | POA: Diagnosis not present

## 2018-05-10 DIAGNOSIS — E785 Hyperlipidemia, unspecified: Secondary | ICD-10-CM | POA: Diagnosis not present

## 2018-05-11 LAB — BASIC METABOLIC PANEL
BUN/Creatinine Ratio: 11 (ref 9–20)
BUN: 9 mg/dL (ref 6–24)
CO2: 24 mmol/L (ref 20–29)
Calcium: 8.8 mg/dL (ref 8.7–10.2)
Chloride: 102 mmol/L (ref 96–106)
Creatinine, Ser: 0.85 mg/dL (ref 0.76–1.27)
GFR calc Af Amer: 117 mL/min/{1.73_m2} (ref >59)
GFR calc non Af Amer: 102 mL/min/{1.73_m2} (ref >59)
Glucose: 125 mg/dL — ABNORMAL HIGH (ref 65–99)
Potassium: 4.4 mmol/L (ref 3.5–5.2)
Sodium: 140 mmol/L (ref 134–144)

## 2018-05-11 LAB — HEPATIC FUNCTION PANEL
ALT: 37 IU/L (ref 0–44)
AST: 34 IU/L (ref 0–40)
Albumin: 4.1 g/dL (ref 3.5–5.5)
Alkaline Phosphatase: 74 IU/L (ref 39–117)
Bilirubin Total: <0.2 mg/dL (ref 0.0–1.2)
Bilirubin, Direct: 0.06 mg/dL (ref 0.00–0.40)
Total Protein: 7.5 g/dL (ref 6.0–8.5)

## 2018-05-11 LAB — LIPID PANEL
Chol/HDL Ratio: 3.5 ratio (ref 0.0–5.0)
Cholesterol, Total: 107 mg/dL (ref 100–199)
HDL: 31 mg/dL — ABNORMAL LOW (ref >39)
LDL Calculated: 57 mg/dL (ref 0–99)
Triglycerides: 94 mg/dL (ref 0–149)
VLDL Cholesterol Cal: 19 mg/dL (ref 5–40)

## 2018-05-11 LAB — MICROALBUMIN / CREATININE URINE RATIO
Creatinine, Urine: 123 mg/dL
Microalb/Creat Ratio: 6.7 mg/g creat (ref 0.0–30.0)
Microalbumin, Urine: 8.3 ug/mL

## 2018-05-11 LAB — PSA: Prostate Specific Ag, Serum: 1.3 ng/mL (ref 0.0–4.0)

## 2018-05-12 ENCOUNTER — Encounter: Payer: Self-pay | Admitting: Family Medicine

## 2018-06-24 ENCOUNTER — Other Ambulatory Visit: Payer: Self-pay | Admitting: Family Medicine

## 2018-06-24 ENCOUNTER — Telehealth: Payer: Self-pay | Admitting: Family Medicine

## 2018-06-24 ENCOUNTER — Other Ambulatory Visit: Payer: Self-pay | Admitting: *Deleted

## 2018-06-24 MED ORDER — METFORMIN HCL 500 MG PO TABS: ORAL_TABLET | ORAL | 2 refills | Status: DC

## 2018-06-24 NOTE — Telephone Encounter (Signed)
Enough Refills sent to pharm to get him through til appt on aug 15th per dr Richardson Landry. Left message to return call to let pt know.

## 2018-06-24 NOTE — Telephone Encounter (Signed)
Patient is requesting a refill on his Metformin.    [facility] on [address]

## 2018-06-25 NOTE — Telephone Encounter (Signed)
Left message to return call 

## 2018-06-28 NOTE — Telephone Encounter (Signed)
Spoke with patient and pt verbalized understanding.

## 2018-08-08 ENCOUNTER — Encounter: Payer: BLUE CROSS/BLUE SHIELD | Admitting: Family Medicine

## 2018-08-09 ENCOUNTER — Ambulatory Visit (INDEPENDENT_AMBULATORY_CARE_PROVIDER_SITE_OTHER): Payer: BLUE CROSS/BLUE SHIELD | Admitting: Family Medicine

## 2018-08-09 VITALS — BP 150/90 | Ht 73.0 in | Wt 296.2 lb

## 2018-08-09 DIAGNOSIS — E785 Hyperlipidemia, unspecified: Secondary | ICD-10-CM

## 2018-08-09 DIAGNOSIS — L600 Ingrowing nail: Secondary | ICD-10-CM | POA: Diagnosis not present

## 2018-08-09 DIAGNOSIS — Z0000 Encounter for general adult medical examination without abnormal findings: Secondary | ICD-10-CM | POA: Diagnosis not present

## 2018-08-09 DIAGNOSIS — E119 Type 2 diabetes mellitus without complications: Secondary | ICD-10-CM

## 2018-08-09 DIAGNOSIS — Z1211 Encounter for screening for malignant neoplasm of colon: Secondary | ICD-10-CM | POA: Diagnosis not present

## 2018-08-09 DIAGNOSIS — I10 Essential (primary) hypertension: Secondary | ICD-10-CM | POA: Diagnosis not present

## 2018-08-09 LAB — POCT GLYCOSYLATED HEMOGLOBIN (HGB A1C): Hemoglobin A1C: 5.6 % (ref 4.0–5.6)

## 2018-08-09 MED ORDER — METFORMIN HCL 500 MG PO TABS: ORAL_TABLET | ORAL | 2 refills | Status: DC

## 2018-08-09 MED ORDER — AMOXICILLIN-POT CLAVULANATE 875-125 MG PO TABS: 1.0000 | ORAL_TABLET | Freq: Two times a day (BID) | ORAL | 0 refills | Status: AC

## 2018-08-09 MED ORDER — DILTIAZEM HCL ER BEADS 420 MG PO CP24: 420.0000 mg | ORAL_CAPSULE | Freq: Every day | ORAL | 5 refills | Status: DC

## 2018-08-09 MED ORDER — ATORVASTATIN CALCIUM 20 MG PO TABS: 20.0000 mg | ORAL_TABLET | Freq: Every day | ORAL | 5 refills | Status: DC

## 2018-08-09 MED ORDER — BENAZEPRIL-HYDROCHLOROTHIAZIDE 20-25 MG PO TABS: 1.0000 | ORAL_TABLET | Freq: Every day | ORAL | 5 refills | Status: DC

## 2018-08-09 MED ORDER — GLIPIZIDE 5 MG PO TABS: ORAL_TABLET | ORAL | 5 refills | Status: DC

## 2018-08-09 NOTE — Progress Notes (Signed)
Subjective:    Patient ID: Caleb Ballard, male    DOB: [birth year], 52 y.o.   MRN: [id number] Patient arrives for wellness exam plus numerous concerns HPI  The patient comes in today for a wellness visit.    A review of their health history was completed.  A review of medications was also completed.  Any needed refills; yes  Eating habits: eating healthy  Falls/  MVA accidents in past few months: none  Regular exercise: yes lots of walking and has started working out as [place] pt sees on regular basis: none  Preventative health issues were discussed.   Additional concerns: none  Results for orders placed or performed in visit on 08/09/18  POCT glycosylated hemoglobin (Hb A1C)  Result Value Ref Range   Hemoglobin A1C 5.6 4.0 - 5.6 %   HbA1c POC (<> result, manual entry)     HbA1c, POC (prediabetic range)     HbA1c, POC (controlled diabetic range)     Walking three miles per day, exrcising fpr tirty minutes  Walked another  [place] miles   Patient claims compliance with diabetes medication. No obvious side effects. Reports no substantial low sugar spells. Most numbers are generally in good range when checked fasting. Generally does not miss a dose of medication. Watching diabetic diet closely  Blood pressure medicine and blood pressure levels reviewed today with patient. Compliant with blood pressure medicine. States does not miss a dose. No obvious side effects. Blood pressure generally good when checked elsewhere. Watching salt intake.  Fa has prostte cancer    eye dr visit none recentl  Patient notes some difficulty with his left great toe recent months pain tenderness discharge no fevers   Review of Systems  Constitutional: Negative for activity change, appetite change and fever.  HENT: Negative for congestion and rhinorrhea.   Eyes: Negative for discharge.  Respiratory: Negative for cough and wheezing.   Cardiovascular: Negative for chest pain.    Gastrointestinal: Negative for abdominal pain, blood in stool and vomiting.  Genitourinary: Negative for difficulty urinating and frequency.  Musculoskeletal: Negative for neck pain.  Skin: Negative for rash.  Allergic/Immunologic: Negative for environmental allergies and food allergies.  Neurological: Negative for weakness and headaches.  Psychiatric/Behavioral: Negative for agitation.  All other systems reviewed and are negative.      Objective:   Physical Exam  Constitutional: He appears well-developed and well-nourished.  Obesity present  HENT:  Head: Normocephalic and atraumatic.  Right Ear: External ear normal.  Left Ear: External ear normal.  Nose: Nose normal.  Mouth/Throat: Oropharynx is clear and moist.  Eyes: Pupils are equal, round, and reactive to light. EOM are normal.  Neck: Normal range of motion. Neck supple. No thyromegaly present.  Cardiovascular: Normal rate, regular rhythm and normal heart sounds.  No murmur heard. Pulmonary/Chest: Effort normal and breath sounds normal. No respiratory distress. He has no wheezes.  Abdominal: Soft. Bowel sounds are normal. He exhibits no distension and no mass. There is no tenderness.  Genitourinary: Penis normal.  Musculoskeletal: Normal range of motion. He exhibits no edema.  Left great toe inflammation crusty medial discharge tender to palpation  Lymphadenopathy:    He has no cervical adenopathy.  Neurological: He is alert. He exhibits normal muscle tone.  Skin: Skin is warm and dry. No erythema.  Psychiatric: He has a normal mood and affect. His behavior is normal. Judgment normal.  Vitals reviewed.         Assessment &  Plan:  1 impression wellness exam.  Colonoscopy discussed strongly encouraged we will set up eye doctor visit encouraged.  Diet discussed.  Exercise discussed.  2.  Type 2 diabetes.  Excellent control discussed maintain same  3.  Hypertension.  Good control.  Discussed.  Compliance  discussed.  4.  Hyperlipidemia ongoing maintain Lipitor rationale discussed  5.  Large toe ingrown nail.  Sensory exam intact.  Positive infection.  He is prescribed.

## 2018-08-13 DIAGNOSIS — H1033 Unspecified acute conjunctivitis, bilateral: Secondary | ICD-10-CM | POA: Diagnosis not present

## 2018-08-21 ENCOUNTER — Encounter: Payer: Self-pay | Admitting: Family Medicine

## 2018-08-28 DIAGNOSIS — L600 Ingrowing nail: Secondary | ICD-10-CM | POA: Diagnosis not present

## 2018-08-28 DIAGNOSIS — E119 Type 2 diabetes mellitus without complications: Secondary | ICD-10-CM | POA: Diagnosis not present

## 2018-08-28 DIAGNOSIS — L03032 Cellulitis of left toe: Secondary | ICD-10-CM | POA: Diagnosis not present

## 2018-08-28 DIAGNOSIS — M79675 Pain in left toe(s): Secondary | ICD-10-CM | POA: Diagnosis not present

## 2018-09-16 DIAGNOSIS — L03032 Cellulitis of left toe: Secondary | ICD-10-CM | POA: Diagnosis not present

## 2018-09-16 DIAGNOSIS — L600 Ingrowing nail: Secondary | ICD-10-CM | POA: Diagnosis not present

## 2018-09-16 DIAGNOSIS — M79675 Pain in left toe(s): Secondary | ICD-10-CM | POA: Diagnosis not present

## 2018-09-16 DIAGNOSIS — E119 Type 2 diabetes mellitus without complications: Secondary | ICD-10-CM | POA: Diagnosis not present

## 2018-09-18 ENCOUNTER — Ambulatory Visit (INDEPENDENT_AMBULATORY_CARE_PROVIDER_SITE_OTHER): Payer: Self-pay

## 2018-09-18 DIAGNOSIS — Z1211 Encounter for screening for malignant neoplasm of colon: Secondary | ICD-10-CM

## 2018-09-18 MED ORDER — NA SULFATE-K SULFATE-MG SULF 17.5-3.13-1.6 GM/177ML PO SOLN
1.0000 | ORAL | 0 refills | Status: DC
Start: 2018-09-18 — End: 2018-10-30

## 2018-09-18 NOTE — Progress Notes (Signed)
Gastroenterology Pre-Procedure Review  Request Date:09/18/18 Requesting Physician: Dr.Luking no previous tcs  PATIENT REVIEW QUESTIONS: The patient responded to the following health history questions as indicated:    1. 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 Joint replacements in the past 12 months: no 3. Major health problems in the past 3 months: no 4. Has an artificial valve or MVP: no 5. Has a defibrillator: no 6. Has been advised in past to take antibiotics in advance of a procedure like teeth cleaning: no 7. Family history of colon cancer: no  8. Alcohol Use: no 9. History of sleep apnea: no  10. History of coronary artery or other vascular stents placed within the last 12 months: no 11. History of any prior anesthesia complications: no    MEDICATIONS & ALLERGIES:    Patient reports the following regarding taking any blood thinners:   Plavix? no Aspirin? no Coumadin? no Brilinta? no Xarelto? no Eliquis? no Pradaxa? no Savaysa? no Effient? no  Patient confirms/reports the following medications:  Current Outpatient Medications  Medication Sig Dispense Refill  . atorvastatin (LIPITOR) 20 MG tablet Take 1 tablet (20 mg total) by mouth at bedtime. 30 tablet 5  . benazepril-hydrochlorthiazide (LOTENSIN HCT) 20-25 MG tablet Take 1 tablet by mouth daily. 30 tablet 5  . cetirizine (ZYRTEC) 10 MG tablet Take 10 mg by mouth daily. Reported on 05/04/2016    . diltiazem (TIAZAC) 420 MG 24 hr capsule Take 1 capsule (420 mg total) by mouth daily. 30 capsule 5  . glipiZIDE (GLUCOTROL) 5 MG tablet Take 2 tablets by mouth twice daily 120 tablet 5  . metFORMIN (GLUCOPHAGE) 500 MG tablet TAKE 2 TABLETS BY MOUTH TWICE DAILY WITH FOOD. 120 tablet 2  . BAYER CONTOUR NEXT TEST test strip TEST ONCE DAILY AS DIRECTED 25 each 0  . blood glucose meter kit and supplies KIT Dispense based on patient and insurance preference. Test blood sugar once daily. Diabetes type E11.9 1 each 5    No current facility-administered medications for this visit.     Patient confirms/reports the following allergies:  No Known Allergies  No orders of the defined types were placed in this encounter.   AUTHORIZATION INFORMATION Primary Insurance: BCBS [place]  ID #: [id number] Pre-Cert / Josem Kaufmann required: no   SCHEDULE INFORMATION: Procedure has been scheduled as follows:  Date: 10/30/18, Time: 1:00 Location: [facility] Dr[facility]  This Gastroenterology Pre-Precedure Review Form is being routed to the following provider(s): Neil Crouch, PA

## 2018-09-18 NOTE — Patient Instructions (Signed)
Caleb Ballard  [birth year] MRN: [id number]     Procedure Date: 10/30/18 Time to register: 12:00pm Place to register: [facility] Procedure Time:1:00pm Scheduled provider: Barney Drain, MD    PREPARATION FOR COLONOSCOPY WITH SUPREP BOWEL PREP KIT  Note: Suprep Bowel Prep Kit is a split-dose (2day) regimen. Consumption of BOTH 6-ounce bottles is required for a complete prep.  Please notify us immediately if you are diabetic, take iron supplements, or if you are on Coumadin or any other blood thinners.         I will mail you a letter with your diabetes medication instructions.                                                                                                                                        1 DAY BEFORE PROCEDURE:  DATE: 10/29/18   DAY: Tuesday  clear liquids the entire day - NO SOLID FOOD.    At 6:00pm: Complete steps 1 through 4 below, using ONE (1) 6-ounce bottle, before going to bed. Step 1:  Pour ONE (1) 6-ounce bottle of SUPREP liquid into the mixing container.  Step 2:  Add cool drinking water to the 16 ounce line on the container and mix.  Note: Dilute the solution concentrate as directed prior to use. Step 3:  DRINK ALL the liquid in the container. Step 4:  You MUST drink an additional two (2) or more 16 ounce containers of water over the next one (1) hour.   Continue clear liquids.  DAY OF PROCEDURE:   DATE: 10/30/18   DAY: Wednesday If you take medications for your heart, blood pressure, or breathing, you may take these medications.  Diabetic medications adjustments for today:see letter  5 hours before your procedure at :8:00am Step 1:  Pour ONE (1) 6-ounce bottle of SUPREP liquid into the mixing container.  Step 2:  Add cool drinking water to the 16 ounce line on the container and mix.  Note: Dilute the solution concentrate as directed prior to use. Step 3:  DRINK ALL the liquid in the container. Step 4:  You MUST drink an additional two  (2) or more 16 ounce containers of water over the next one (1) hour. You MUST complete the final glass of water at least 3 hours before your colonoscopy.   Nothing by mouth past 10:00am  You may take your morning medications with sip of water unless we have instructed otherwise.    Please see below for Dietary Information.  CLEAR LIQUIDS INCLUDE:  Water Jello (NOT red in color)   Ice Popsicles (NOT red in color)   Tea (sugar ok, no milk/cream) Powdered fruit flavored drinks  Coffee (sugar ok, no milk/cream) Gatorade/ Lemonade/ Kool-Aid  (NOT red in color)   Juice: apple, white grape, white cranberry Soft drinks  Clear bullion, consomme, broth (fat free beef/chicken/vegetable)  Carbonated beverages (any kind)  Strained chicken noodle soup Hard Candy   Remember: Clear liquids are liquids that will allow you to see your fingers on the other side of a clear glass. Be sure liquids are NOT red in color, and not cloudy, but CLEAR.  DO NOT EAT OR DRINK ANY OF THE FOLLOWING:  Dairy products of any kind   Cranberry juice Tomato juice / V8 juice   Grapefruit juice Orange juice     Red grape juice  Do not eat any solid foods, including such foods as: cereal, oatmeal, yogurt, fruits, vegetables, creamed soups, eggs, bread, crackers, pureed foods in a blender, etc.   HELPFUL HINTS FOR DRINKING PREP SOLUTION:   Make sure prep is extremely cold. Mix and refrigerate the the morning of the prep. You may also put in the freezer.   You may try mixing some Crystal Light or Country Time Lemonade if you prefer. Mix in small amounts; add more if necessary.  Try drinking through a straw  Rinse mouth with water or a mouthwash between glasses, to remove after-taste.  Try sipping on a cold beverage /ice/ popsicles between glasses of prep.  Place a piece of sugar-free hard candy in mouth between glasses.  If you become nauseated, try consuming smaller amounts, or stretch out the time between glasses.  Stop for 30-60 minutes, then slowly start back drinking.     OTHER INSTRUCTIONS  You will need a responsible adult at least 51 years of age to accompany you and drive you home. This person must remain in the waiting room during your procedure. The hospital will cancel your procedure if you do not have a responsible adult with you.   1. Wear loose fitting clothing that is easily removed. 2. Leave jewelry and other valuables at home.  3. Remove all body piercing jewelry and leave at home. 4. Total time from sign-in until discharge is approximately 2-3 hours. 5. You should go home directly after your procedure and rest. You can resume normal activities the day after your procedure. 6. The day of your procedure you should not:  Drive  Make legal decisions  Operate machinery  Drink alcohol  Return to work   You may call the office (Dept: [phone]) before 5:00pm, or page the doctor on call [phone]) after 5:00pm, for further instructions, if necessary.   Insurance Information YOU WILL NEED TO CHECK WITH YOUR INSURANCE COMPANY FOR THE BENEFITS OF COVERAGE YOU HAVE FOR THIS PROCEDURE.  UNFORTUNATELY, NOT ALL INSURANCE COMPANIES HAVE BENEFITS TO COVER ALL OR PART OF THESE TYPES OF PROCEDURES.  IT IS YOUR RESPONSIBILITY TO CHECK YOUR BENEFITS, HOWEVER, WE WILL BE GLAD TO ASSIST YOU WITH ANY CODES YOUR INSURANCE COMPANY MAY NEED.    PLEASE NOTE THAT MOST INSURANCE COMPANIES WILL NOT COVER A SCREENING COLONOSCOPY FOR PEOPLE UNDER THE AGE OF 50  IF YOU HAVE BCBS INSURANCE, YOU MAY HAVE BENEFITS FOR A SCREENING COLONOSCOPY BUT IF POLYPS ARE FOUND THE DIAGNOSIS WILL CHANGE AND THEN YOU MAY HAVE A DEDUCTIBLE THAT WILL NEED TO BE MET. SO PLEASE MAKE SURE YOU CHECK YOUR BENEFITS FOR A SCREENING COLONOSCOPY AS WELL AS A DIAGNOSTIC COLONOSCOPY.

## 2018-09-23 NOTE — Progress Notes (Signed)
Letter mailed to the pt with DM medication adjustments.  

## 2018-09-23 NOTE — Progress Notes (Signed)
Ok to schedule.  Day before tcs: glipizide 1 tablet bid, metformin 1 tablet bid AM of TCS: hold glipizide and metformin.

## 2018-10-23 ENCOUNTER — Encounter: Payer: Self-pay | Admitting: Family Medicine

## 2018-10-23 ENCOUNTER — Ambulatory Visit: Payer: BLUE CROSS/BLUE SHIELD | Admitting: Family Medicine

## 2018-10-23 VITALS — BP 162/98 | Ht 73.0 in | Wt 293.0 lb

## 2018-10-23 DIAGNOSIS — H1032 Unspecified acute conjunctivitis, left eye: Secondary | ICD-10-CM

## 2018-10-23 MED ORDER — TOBRAMYCIN-DEXAMETHASONE 0.3-0.1 % OP SUSP: OPHTHALMIC | 0 refills | Status: DC

## 2018-10-23 NOTE — Progress Notes (Signed)
   Subjective:    Patient ID: Caleb Ballard, male    DOB: 16-Dec-1966, 52 y.o.   MRN: [id number]  HPI  Patient is here today due to an left eye infection.He states this began in August and he saw Dr. Jorja Loa and he prescribed something for it at that time and it cleared up.  Eye birunoing and sensitive to light  Stinging red sensaion   now has no eye drops  Helped before   Tried to call his eye doc   He had tried to get in with him recently for this problem,but he had no availability.  Review of Systems No headache, no major weight loss or weight gain, no chest pain no back pain abdominal pain no change in bowel habits complete ROS otherwise negative     Objective:   Physical Exam  Alert vitals stable, NAD. Blood pressure good on repeat. HEENT left eye impressive scleral injection.  Some exudate.  No obvious flare.  Ophthalmoscopic exam otherwise normal.  Normal. Lungs clear. Heart regular rate and rhythm.       Assessment & Plan:  Impression persistent conjunctivitis discussed we will press on and cover with TobraDex.  If persists encouraged to get back to the eye doctor rationale discussed

## 2018-10-30 ENCOUNTER — Encounter (HOSPITAL_COMMUNITY): Payer: Self-pay | Admitting: Gastroenterology

## 2018-10-30 ENCOUNTER — Encounter (HOSPITAL_COMMUNITY)
Admission: RE | Disposition: A | Discharge status: Home / Self Care | Payer: Self-pay | Source: Ambulatory Visit | Attending: Gastroenterology

## 2018-10-30 ENCOUNTER — Ambulatory Visit (HOSPITAL_COMMUNITY)
Admission: RE | Admit: 2018-10-30 | Discharge: 2018-10-30 | Disposition: A | Discharge status: Home / Self Care | Payer: BLUE CROSS/BLUE SHIELD | Source: Ambulatory Visit | Attending: Gastroenterology | Admitting: Gastroenterology

## 2018-10-30 ENCOUNTER — Other Ambulatory Visit: Payer: Self-pay

## 2018-10-30 DIAGNOSIS — Z8249 Family history of ischemic heart disease and other diseases of the circulatory system: Secondary | ICD-10-CM | POA: Insufficient documentation

## 2018-10-30 DIAGNOSIS — Z1211 Encounter for screening for malignant neoplasm of colon: Secondary | ICD-10-CM | POA: Diagnosis not present

## 2018-10-30 DIAGNOSIS — K5730 Diverticulosis of large intestine without perforation or abscess without bleeding: Secondary | ICD-10-CM | POA: Insufficient documentation

## 2018-10-30 DIAGNOSIS — Z79899 Other long term (current) drug therapy: Secondary | ICD-10-CM | POA: Insufficient documentation

## 2018-10-30 DIAGNOSIS — Z7984 Long term (current) use of oral hypoglycemic drugs: Secondary | ICD-10-CM | POA: Diagnosis not present

## 2018-10-30 DIAGNOSIS — D120 Benign neoplasm of cecum: Secondary | ICD-10-CM | POA: Insufficient documentation

## 2018-10-30 DIAGNOSIS — K648 Other hemorrhoids: Secondary | ICD-10-CM | POA: Insufficient documentation

## 2018-10-30 DIAGNOSIS — D122 Benign neoplasm of ascending colon: Secondary | ICD-10-CM | POA: Insufficient documentation

## 2018-10-30 DIAGNOSIS — E785 Hyperlipidemia, unspecified: Secondary | ICD-10-CM | POA: Diagnosis not present

## 2018-10-30 DIAGNOSIS — E119 Type 2 diabetes mellitus without complications: Secondary | ICD-10-CM | POA: Diagnosis not present

## 2018-10-30 DIAGNOSIS — I10 Essential (primary) hypertension: Secondary | ICD-10-CM | POA: Insufficient documentation

## 2018-10-30 HISTORY — PX: COLONOSCOPY: SHX5424

## 2018-10-30 HISTORY — PX: POLYPECTOMY: SHX5525

## 2018-10-30 SURGERY — COLONOSCOPY
Anesthesia: Moderate Sedation

## 2018-10-30 MED ORDER — STERILE WATER FOR IRRIGATION IR SOLN
Status: DC | PRN
  Administered 2018-10-30: 100 mL

## 2018-10-30 MED ORDER — MIDAZOLAM HCL 5 MG/5ML IJ SOLN
INTRAMUSCULAR | Status: AC
  Filled 2018-10-30: qty 10

## 2018-10-30 MED ORDER — MIDAZOLAM HCL 5 MG/5ML IJ SOLN
INTRAMUSCULAR | Status: DC | PRN
  Administered 2018-10-30: 2 mg via INTRAVENOUS
  Administered 2018-10-30: 1 mg via INTRAVENOUS
  Administered 2018-10-30 (×2): 2 mg via INTRAVENOUS
  Administered 2018-10-30: 1 mg via INTRAVENOUS

## 2018-10-30 MED ORDER — MEPERIDINE HCL 100 MG/ML IJ SOLN
INTRAMUSCULAR | Status: AC
  Filled 2018-10-30: qty 2

## 2018-10-30 MED ORDER — SODIUM CHLORIDE 0.9 % IV SOLN
INTRAVENOUS | Status: DC
  Administered 2018-10-30: 12:00:00 via INTRAVENOUS

## 2018-10-30 MED ORDER — MEPERIDINE HCL 100 MG/ML IJ SOLN
INTRAMUSCULAR | Status: DC | PRN
  Administered 2018-10-30 (×2): 25 mg via INTRAVENOUS
  Administered 2018-10-30: 50 mg via INTRAVENOUS

## 2018-10-30 NOTE — Discharge Instructions (Signed)
You have small internal hemorrhoids and diverticulosis IN YOUR LEFT AND RIGHT COLON. YOU HAD THREE SMALL POLYPS REMOVED.    DRINK WATER TO KEEP YOUR URINE LIGHT YELLOW.  CONTINUE YOUR WEIGHT LOSS EFFORTS. YOUR BODY MASS INDEX IS OVER 30 WHICH MEANS YOU ARE OBESE. OBESITY IS ASSOCIATED WITH AN INCREASE FOR ALL CANCERS, INCLUDING ESOPHAGEAL AND COLON CANCER.  FOLLOW A HIGH FIBER DIET. AVOID ITEMS THAT CAUSE BLOATING. See info below.   YOUR BIOPSY RESULTS WILL BE AVAILABLE IN MY CHART AFTER     AND MY OFFICE WILL CONTACT YOU IN 10-14 DAYS WITH YOUR RESULTS.    USE PREPARATION H FOUR TIMES  A DAY IF NEEDED TO RELIEVE RECTAL PAIN/PRESSURE/BLEEDING.   Next colonoscopy in 3 years.  Colonoscopy Care After Read the instructions outlined below and refer to this sheet in the next week. These discharge instructions provide you with general information on caring for yourself after you leave the hospital. While your treatment has been planned according to the most current medical practices available, unavoidable complications occasionally occur. If you have any problems or questions after discharge, call DR. Idabell Picking, [phone].  ACTIVITY  You may resume your regular activity, but move at a slower pace for the next 24 hours.   Take frequent rest periods for the next 24 hours.   Walking will help get rid of the air and reduce the bloated feeling in your belly (abdomen).   No driving for 24 hours (because of the medicine (anesthesia) used during the test).   You may shower.   Do not sign any important legal documents or operate any machinery for 24 hours (because of the anesthesia used during the test).    NUTRITION  Drink plenty of fluids.   You may resume your normal diet as instructed by your doctor.   Begin with a light meal and progress to your normal diet. Heavy or fried foods are harder to digest and may make you feel sick to your stomach (nauseated).   Avoid alcoholic beverages  for 24 hours or as instructed.    MEDICATIONS  You may resume your normal medications.   WHAT YOU CAN EXPECT TODAY  Some feelings of bloating in the abdomen.   Passage of more gas than usual.   Spotting of blood in your stool or on the toilet paper  .  IF YOU HAD POLYPS REMOVED DURING THE COLONOSCOPY:  Eat a soft diet IF YOU HAVE NAUSEA, BLOATING, ABDOMINAL PAIN, OR VOMITING.    FINDING OUT THE RESULTS OF YOUR TEST Not all test results are available during your visit. DR. Oneida Alar WILL CALL YOU WITHIN 14 DAYS OF YOUR PROCEDUE WITH YOUR RESULTS. Do not assume everything is normal if you have not heard from DR. Areal Cochrane, CALL HER OFFICE AT [phone].  SEEK IMMEDIATE MEDICAL ATTENTION AND CALL THE OFFICE: [phone] IF:  You have more than a spotting of blood in your stool.   Your belly is swollen (abdominal distention).   You are nauseated or vomiting.   You have a temperature over 101F.   You have abdominal pain or discomfort that is severe or gets worse throughout the day.  High-Fiber Diet A high-fiber diet changes your normal diet to include more whole grains, legumes, fruits, and vegetables. Changes in the diet involve replacing refined carbohydrates with unrefined foods. The calorie level of the diet is essentially unchanged. The Dietary Reference Intake (recommended amount) for adult males is 38 grams per day. For adult females, it is 9  grams per day. Pregnant and lactating women should consume 28 grams of fiber per day. Fiber is the intact part of a plant that is not broken down during digestion. Functional fiber is fiber that has been isolated from the plant to provide a beneficial effect in the body.  PURPOSE  Increase stool bulk.   Ease and regulate bowel movements.   Lower cholesterol.   REDUCE RISK OF COLON CANCER  INDICATIONS THAT YOU NEED MORE FIBER  Constipation and hemorrhoids.   Uncomplicated diverticulosis (intestine condition) and irritable  bowel syndrome.   Weight management.   As a protective measure against hardening of the arteries (atherosclerosis), diabetes, and cancer.   GUIDELINES FOR INCREASING FIBER IN THE DIET  Start adding fiber to the diet slowly. A gradual increase of about 5 more grams (2 slices of whole-wheat bread, 2 servings of most fruits or vegetables, or 1 bowl of high-fiber cereal) per day is best. Too rapid an increase in fiber may result in constipation, flatulence, and bloating.   Drink enough water and fluids to keep your urine clear or pale yellow. Water, juice, or caffeine-free drinks are recommended. Not drinking enough fluid may cause constipation.   Eat a variety of high-fiber foods rather than one type of fiber.   Try to increase your intake of fiber through using high-fiber foods rather than fiber pills or supplements that contain small amounts of fiber.   The goal is to change the types of food eaten. Do not supplement your present diet with high-fiber foods, but replace foods in your present diet.   INCLUDE A VARIETY OF FIBER SOURCES  Replace refined and processed grains with whole grains, canned fruits with fresh fruits, and incorporate other fiber sources. White rice, white breads, and most bakery goods contain little or no fiber.   Brown whole-grain rice, buckwheat oats, and many fruits and vegetables are all good sources of fiber. These include: broccoli, Brussels sprouts, cabbage, cauliflower, beets, sweet potatoes, white potatoes (skin on), carrots, tomatoes, eggplant, squash, berries, fresh fruits, and dried fruits.   Cereals appear to be the richest source of fiber. Cereal fiber is found in whole grains and bran. Bran is the fiber-rich outer coat of cereal grain, which is largely removed in refining. In whole-grain cereals, the bran remains. In breakfast cereals, the largest amount of fiber is found in those with "bran" in their names. The fiber content is sometimes indicated on the  label.   You may need to include additional fruits and vegetables each day.   In baking, for 1 cup white flour, you may use the following substitutions:   1 cup whole-wheat flour minus 2 tablespoons.   1/2 cup white flour plus 1/2 cup whole-wheat flour.   Polyps, Colon  A polyp is extra tissue that grows inside your body. Colon polyps grow in the large intestine. The large intestine, also called the colon, is part of your digestive system. It is a long, hollow tube at the end of your digestive tract where your body makes and stores stool. Most polyps are not dangerous. They are benign. This means they are not cancerous. But over time, some types of polyps can turn into cancer. Polyps that are smaller than a pea are usually not harmful. But larger polyps could someday become or may already be cancerous. To be safe, doctors remove all polyps and test them.   PREVENTION There is not one sure way to prevent polyps. You might be able to lower your risk  of getting them if you:  Eat more fruits and vegetables and less fatty food.   Do not smoke.   Avoid alcohol.   Exercise every day.   Lose weight if you are overweight.   Eating more calcium and folate can also lower your risk of getting polyps. Some foods that are rich in calcium are milk, cheese, and broccoli. Some foods that are rich in folate are chickpeas, kidney beans, and spinach.    Diverticulosis Diverticulosis is a common condition that develops when small pouches (diverticula) form in the wall of the colon. The risk of diverticulosis increases with age. It happens more often in people who eat a low-fiber diet. Most individuals with diverticulosis have no symptoms. Those individuals with symptoms usually experience belly (abdominal) pain, constipation, or loose stools (diarrhea).  HOME CARE INSTRUCTIONS  Increase the amount of fiber in your diet as directed by your caregiver or dietician. This may reduce symptoms of  diverticulosis.   Drink at least 6 to 8 glasses of water each day to prevent constipation.   Try not to strain when you have a bowel movement.   Avoiding nuts and seeds to prevent complications is NOT NECESSARY.   FOODS HAVING HIGH FIBER CONTENT INCLUDE:  Fruits. Apple, peach, pear, tangerine, raisins, prunes.   Vegetables. Brussels sprouts, asparagus, broccoli, cabbage, carrot, cauliflower, romaine lettuce, spinach, summer squash, tomato, winter squash, zucchini.   Starchy Vegetables. Baked beans, kidney beans, lima beans, split peas, lentils, potatoes (with skin).   Grains. Whole wheat bread, brown rice, bran flake cereal, plain oatmeal, white rice, shredded wheat, bran muffins.   SEEK IMMEDIATE MEDICAL CARE IF:  You develop increasing pain or severe bloating.   You have an oral temperature above 101F.   You develop vomiting or bowel movements that are bloody or black.

## 2018-10-30 NOTE — Op Note (Signed)
Caleb Ballard Patient Name: Caleb Ballard Procedure Date: 10/30/2018 11:51 AM MRN: [id number] Date of Birth: [birth year] Attending MD: Barney Drain MD, MD CSN: [id number] Age: 52 Admit Type: Outpatient Procedure:                Colonoscopy WITH COLD SNARE POLYPECTOMY Indications:              Screening for colorectal malignant neoplasm Providers:                Barney Drain MD, MD, Janeece Riggers, RN, Rosina Lowenstein,                            RN Referring MD:             Grace Bushy. Luking Medicines:                Meperidine 100 mg IV, Midazolam 8 mg IV Complications:            No immediate complications. Estimated Blood Loss:     Estimated blood loss was minimal. Procedure:                Pre-Anesthesia Assessment:                           - Prior to the procedure, a History and Physical                            was performed, and patient medications and                            allergies were reviewed. The patient's tolerance of                            previous anesthesia was also reviewed. The risks                            and benefits of the procedure and the sedation                            options and risks were discussed with the patient.                            All questions were answered, and informed consent                            was obtained. Prior Anticoagulants: The patient has                            taken no previous anticoagulant or antiplatelet                            agents. ASA Grade Assessment: II - A patient with                            mild systemic disease. After reviewing the risks  and benefits, the patient was deemed in                            satisfactory condition to undergo the procedure.                            After obtaining informed consent, the colonoscope                            was passed under direct vision. Throughout the                            procedure, the patient's blood  pressure, pulse, and                            oxygen saturations were monitored continuously. The                            CF-[id number] ([id number]) scope was introduced through                            the anus and advanced to the the cecum, identified                            by appendiceal orifice and ileocecal valve. The                            colonoscopy was somewhat difficult due to a                            tortuous colon. Successful completion of the                            procedure was aided by increasing the dose of                            sedation medication, straightening and shortening                            the scope to obtain bowel loop reduction and                            COLOWRAP. The patient tolerated the procedure                            fairly well IN SPITE OF REMIANING AWAKE DURING THE                            PROCEDURE. The quality of the bowel preparation was                            good. The ileocecal valve, appendiceal orifice, and  rectum were photographed. Scope In: 1:08:15 PM Scope Out: 1:27:04 PM Scope Withdrawal Time: 0 hours 13 minutes 59 seconds  Total Procedure Duration: 0 hours 18 minutes 49 seconds  Findings:      Three sessile polyps were found in the ascending colon and cecum. The       polyps were 2 to 4 mm in size. These polyps were removed with a cold       snare. Resection and retrieval were complete.      Multiple small and large-mouthed diverticula were found in the entire       colon.      Internal hemorrhoids were found. The hemorrhoids were moderate.      The recto-sigmoid colon, sigmoid colon and descending colon were       moderately tortuous. Impression:               - Three 2 to 4 mm polyps in the ascending colon and                            in the cecum, removed with a cold snare. Resected                            and retrieved.                           - Diverticulosis in  the entire examined colon.                           - Internal hemorrhoids.                           - Tortuous colon. Moderate Sedation:      Moderate (conscious) sedation was administered by the endoscopy nurse       and supervised by the endoscopist. The following parameters were       monitored: oxygen saturation, heart rate, blood pressure, and response       to care. Total physician intraservice time was 36 minutes. Recommendation:           - Patient has a contact number available for                            emergencies. The signs and symptoms of potential                            delayed complications were discussed with the                            patient. Return to normal activities tomorrow.                            Written discharge instructions were provided to the                            patient.                           - High fiber diet.                           -  Continue present medications.                           - Await pathology results.                           - Repeat colonoscopy in 3 years for surveillance                            WITH PROPOFOL. Procedure Code(s):        --- Professional ---                           [id number], Colonoscopy, flexible; with removal of                            tumor(s), polyp(s), or other lesion(s) by snare                            technique                           99153, Moderate sedation; each additional 15                            minutes intraservice time                           G0500, Moderate sedation services provided by the                            same physician or other qualified health care                            professional performing a gastrointestinal                            endoscopic service that sedation supports,                            requiring the presence of an independent trained                            observer to assist in the monitoring of the                             patient's level of consciousness and physiological                            status; initial 15 minutes of intra-service time;                            patient age 32 years or older (additional time may                            be reported with [phone], as appropriate)  Diagnosis Code(s):        --- Professional ---                           Z12.11, Encounter for screening for malignant                            neoplasm of colon                           D12.2, Benign neoplasm of ascending colon                           D12.0, Benign neoplasm of cecum                           K64.8, Other hemorrhoids                           K57.30, Diverticulosis of large intestine without                            perforation or abscess without bleeding                           Q43.8, Other specified congenital malformations of                            intestine CPT copyright 2018 [employer]. All rights reserved. The codes documented in this report are preliminary and upon coder review may  be revised to meet current compliance requirements. Barney Drain, MD Barney Drain MD, MD 10/30/2018 1:38:02 PM This report has been signed electronically. Number of Addenda: 0

## 2018-10-30 NOTE — H&P (Signed)
Primary Care Physician:  Mikey Kirschner, MD Primary Gastroenterologist:  Dr. Oneida Alar  Pre-Procedure History & Physical: HPI:  Caleb Ballard is a 52 y.o. male here for COLON CANCER SCREENING.  Past Medical History:  Diagnosis Date  . Diabetes mellitus without complication ([place])    Type II  . Hyperlipidemia   . Hypertension   . Ruptured lumbar disc    injection x 3    Past Surgical History:  Procedure Laterality Date  . APPENDECTOMY    . TONSILLECTOMY      Prior to Admission medications   Medication Sig Start Date End Date Taking? Authorizing Provider  atorvastatin (LIPITOR) 20 MG tablet Take 1 tablet (20 mg total) by mouth at bedtime. 08/09/18  Yes Mikey Kirschner, MD  benazepril-hydrochlorthiazide (LOTENSIN HCT) 20-25 MG tablet Take 1 tablet by mouth daily. 08/09/18  Yes Mikey Kirschner, MD  diltiazem [facility]) 420 MG 24 hr capsule Take 1 capsule (420 mg total) by mouth daily. Patient taking differently: Take 420 mg by mouth at bedtime.  08/09/18  Yes Mikey Kirschner, MD  glipiZIDE (GLUCOTROL) 5 MG tablet Take 2 tablets by mouth twice daily 08/09/18  Yes Mikey Kirschner, MD  metFORMIN (GLUCOPHAGE) 500 MG tablet TAKE 2 TABLETS BY MOUTH TWICE DAILY WITH FOOD. 08/09/18  Yes Mikey Kirschner, MD  Na Sulfate-K Sulfate-Mg Sulf (SUPREP BOWEL PREP KIT) 17.5-3.13-1.6 GM/177ML SOLN Take 1 kit by mouth as directed. 09/18/18  Yes Mahala Menghini, PA-C  BAYER CONTOUR NEXT TEST test strip TEST ONCE DAILY AS DIRECTED 02/15/15   Mikey Kirschner, MD  blood glucose meter kit and supplies KIT Dispense based on patient and insurance preference. Test blood sugar once daily. Diabetes type E11.9 02/03/15   Mikey Kirschner, MD    Allergies as of 09/18/2018  . (No Known Allergies)    Family History  Problem Relation Age of Onset  . Hypertension Mother   . Colon cancer Neg Hx   . Colon polyps Neg Hx     Social History   Socioeconomic History  . Marital status: Single    Spouse  name: Not on file  . Number of children: Not on file  . Years of education: Not on file  . Highest education level: Not on file  Occupational History  . Not on file  Social Needs  . Financial resource strain: Not on file  . Food insecurity:    Worry: Not on file    Inability: Not on file  . Transportation needs:    Medical: Not on file    Non-medical: Not on file  Tobacco Use  . Smoking status: Never Smoker  . Smokeless tobacco: Never Used  Substance and Sexual Activity  . Alcohol use: Not Currently  . Drug use: Not Currently  . Sexual activity: Not on file  Lifestyle  . Physical activity:    Days per week: Not on file    Minutes per session: Not on file  . Stress: Not on file  Relationships  . Social connections:    Talks on phone: Not on file    Gets together: Not on file    Attends religious service: Not on file    Active member of club or organization: Not on file    Attends meetings of clubs or organizations: Not on file    Relationship status: Not on file  . Intimate partner violence:    Fear of current or ex partner: Not on file  Emotionally abused: Not on file    Physically abused: Not on file    Forced sexual activity: Not on file  Other Topics Concern  . Not on file  Social History Narrative  . Not on file    Review of Systems: See HPI, otherwise negative ROS   Physical Exam: BP (!) 165/87   Pulse 81   Temp 97.9 F (36.6 C) (Oral)   Resp 18   Ht _0  (1.854 m)   Wt 132.9 kg   SpO2 98%   BMI 38.66 kg/m  General:   Alert,  pleasant and cooperative in NAD Head:  Normocephalic and atraumatic. Neck:  Supple; Lungs:  Clear throughout to auscultation.    Heart:  Regular rate and rhythm. Abdomen:  Soft, nontender and nondistended. Normal bowel sounds, without guarding, and without rebound.   Neurologic:  Alert and  oriented x4;  grossly normal neurologically.  Impression/Plan:    SCREENING  Plan:  1. TCS TODAY DISCUSSED PROCEDURE,  BENEFITS, & RISKS: < 1% chance of medication reaction, bleeding, perforation, or rupture of spleen/liver.

## 2018-11-01 LAB — GLUCOSE, CAPILLARY: Glucose-Capillary: 91 mg/dL (ref 70–99)

## 2018-11-02 ENCOUNTER — Other Ambulatory Visit: Payer: Self-pay | Admitting: Family Medicine

## 2018-11-06 ENCOUNTER — Encounter (HOSPITAL_COMMUNITY): Payer: Self-pay | Admitting: Gastroenterology

## 2018-11-13 LAB — HM DIABETES EYE EXAM

## 2019-02-10 ENCOUNTER — Ambulatory Visit: Payer: BLUE CROSS/BLUE SHIELD | Admitting: Family Medicine

## 2019-02-10 ENCOUNTER — Encounter: Payer: Self-pay | Admitting: Family Medicine

## 2019-02-10 VITALS — BP 138/78 | Ht 73.0 in | Wt 305.0 lb

## 2019-02-10 DIAGNOSIS — Z125 Encounter for screening for malignant neoplasm of prostate: Secondary | ICD-10-CM

## 2019-02-10 DIAGNOSIS — I10 Essential (primary) hypertension: Secondary | ICD-10-CM

## 2019-02-10 DIAGNOSIS — Z23 Encounter for immunization: Secondary | ICD-10-CM

## 2019-02-10 DIAGNOSIS — E785 Hyperlipidemia, unspecified: Secondary | ICD-10-CM

## 2019-02-10 DIAGNOSIS — Z79899 Other long term (current) drug therapy: Secondary | ICD-10-CM

## 2019-02-10 DIAGNOSIS — E119 Type 2 diabetes mellitus without complications: Secondary | ICD-10-CM

## 2019-02-10 LAB — POCT GLYCOSYLATED HEMOGLOBIN (HGB A1C): Hemoglobin A1C: 5.9 % — AB (ref 4.0–5.6)

## 2019-02-10 MED ORDER — ATORVASTATIN CALCIUM 20 MG PO TABS: 20.0000 mg | ORAL_TABLET | Freq: Every day | ORAL | 5 refills | Status: DC

## 2019-02-10 MED ORDER — BENAZEPRIL-HYDROCHLOROTHIAZIDE 20-25 MG PO TABS: 1.0000 | ORAL_TABLET | Freq: Every day | ORAL | 5 refills | Status: DC

## 2019-02-10 MED ORDER — DILTIAZEM HCL ER BEADS 420 MG PO CP24: 420.0000 mg | ORAL_CAPSULE | Freq: Every day | ORAL | 5 refills | Status: DC

## 2019-02-10 MED ORDER — GLIPIZIDE 5 MG PO TABS: 10.0000 mg | ORAL_TABLET | Freq: Two times a day (BID) | ORAL | 1 refills | Status: DC

## 2019-02-10 MED ORDER — METFORMIN HCL 500 MG PO TABS: ORAL_TABLET | ORAL | 2 refills | Status: DC

## 2019-02-10 NOTE — Progress Notes (Signed)
   Subjective:    Patient ID: Caleb Ballard, male    DOB: [birth year], 53 y.o.   MRN: [id number]  Diabetes  He presents for his follow-up diabetic visit. He has type 2 diabetes mellitus. Current diabetic treatments: metformin, glipizide. He is compliant with treatment all of the time. Home blood sugar record trend: around 120's. He sees a podiatrist.Eye exam is current.   Concerns about a letter pr received from insurance about needing some of his meds changed. Benazepril/hctz and diltiazem.   Results for orders placed or performed in visit on 02/10/19  POCT glycosylated hemoglobin (Hb A1C)  Result Value Ref Range   Hemoglobin A1C 5.9 (A) 4.0 - 5.6 %   HbA1c POC (<> result, manual entry)     HbA1c, POC (prediabetic range)     HbA1c, POC (controlled diabetic range)     Patient continues to take lipid medication regularly. No obvious side effects from it. Generally does not miss a dose. Prior blood work results are reviewed with patient. Patient continues to work on fat intake in diet..  Blood pressure medicine and blood pressure levels reviewed today with patient. Compliant with blood pressure medicine. States does not miss a dose. No obvious side effects. Blood pressure generally good when checked elsewhere. Watching salt intake.   Patient claims compliance with diabetes medication. No obvious side effects. Reports no substantial low sugar spells. Most numbers are generally in good range when checked fasting. Generally does not miss a dose of medication. Watching diabetic diet closely   Fair amnt of exrcise t work   Review of Systems No headache, no major weight loss or weight gain, no chest pain no back pain abdominal pain no change in bowel habits complete ROS otherwise negative     Objective:   Physical Exam   Alert and oriented, vitals reviewed and stable, NAD ENT-TM's and ext canals WNL bilat via otoscopic exam Soft palate, tonsils and post pharynx WNL via oropharyngeal  exam Neck-symmetric, no masses; thyroid nonpalpable and nontender Pulmonary-no tachypnea or accessory muscle use; Clear without wheezes via auscultation Card--no abnrml murmurs, rhythm reg and rate WNL Carotid pulses symmetric, without bruits      Assessment & Plan:  Impression type 2 diabetes.  Discussed.  Good control to maintain same meds compliance discussed  2.  Hypertension.  Blood pressure good on repeat maintain same meds discussed  3.  Hyperlipidemia.  Current status uncertain.  Will check appropriate blood work compliance discussed.  4.  AHA guidelines.  Patient plugging.  Should be on a baby aspirin.  Discussed encouraged to initiate.  Greater than 50% of this 25 minute face to face visit was spent in counseling and discussion and coordination of care regarding the above diagnosis/diagnosies Tdap today

## 2019-06-30 ENCOUNTER — Other Ambulatory Visit: Payer: Self-pay | Admitting: Family Medicine

## 2019-08-11 ENCOUNTER — Other Ambulatory Visit: Payer: Self-pay

## 2019-08-11 ENCOUNTER — Ambulatory Visit (INDEPENDENT_AMBULATORY_CARE_PROVIDER_SITE_OTHER): Payer: BC Managed Care – PPO | Admitting: Family Medicine

## 2019-08-11 VITALS — BP 142/86 | Temp 97.7°F | Wt 308.0 lb

## 2019-08-11 DIAGNOSIS — E119 Type 2 diabetes mellitus without complications: Secondary | ICD-10-CM

## 2019-08-11 DIAGNOSIS — Z79899 Other long term (current) drug therapy: Secondary | ICD-10-CM | POA: Diagnosis not present

## 2019-08-11 DIAGNOSIS — E785 Hyperlipidemia, unspecified: Secondary | ICD-10-CM | POA: Diagnosis not present

## 2019-08-11 DIAGNOSIS — Z125 Encounter for screening for malignant neoplasm of prostate: Secondary | ICD-10-CM

## 2019-08-11 DIAGNOSIS — Z0000 Encounter for general adult medical examination without abnormal findings: Secondary | ICD-10-CM

## 2019-08-11 DIAGNOSIS — I10 Essential (primary) hypertension: Secondary | ICD-10-CM

## 2019-08-11 MED ORDER — DILTIAZEM HCL ER BEADS 420 MG PO CP24: 420.0000 mg | ORAL_CAPSULE | Freq: Every day | ORAL | 5 refills | Status: DC

## 2019-08-11 MED ORDER — GLIPIZIDE 5 MG PO TABS: 10.0000 mg | ORAL_TABLET | Freq: Two times a day (BID) | ORAL | 1 refills | Status: DC

## 2019-08-11 MED ORDER — BENAZEPRIL-HYDROCHLOROTHIAZIDE 20-25 MG PO TABS: 1.0000 | ORAL_TABLET | Freq: Every day | ORAL | 5 refills | Status: DC

## 2019-08-11 MED ORDER — METFORMIN HCL 500 MG PO TABS: ORAL_TABLET | ORAL | 5 refills | Status: DC

## 2019-08-11 MED ORDER — ATORVASTATIN CALCIUM 20 MG PO TABS: 20.0000 mg | ORAL_TABLET | Freq: Every day | ORAL | 5 refills | Status: DC

## 2019-08-11 NOTE — Progress Notes (Signed)
Subjective:    Patient ID: Caleb Ballard, male    DOB: [birth year], 53 y.o.   MRN: [id number]  HPI  The patient comes in today for a wellness visit.    A review of their health history was completed.  A review of medications was also completed.  Any needed refills; yes  Eating habits:decent  Falls/  MVA accidents in past few months: zero  Regular exercise: three mi per day walking  Specialist pt sees on regular basis: no  Preventative health issues were discussed.   Additional concerns: below  Patient claims compliance with diabetes medication. No obvious side effects. Reports no substantial low sugar spells. Most numbers are generally in good range when checked fasting. Generally does not miss a dose of medication. Watching diabetic diet closely  Blood pressure medicine and blood pressure levels reviewed today with patient. Compliant with blood pressure medicine. States does not miss a dose. No obvious side effects. Blood pressure generally good when checked elsewhere. Watching salt intake.   Patient continues to take lipid medication regularly. No obvious side effects from it. Generally does not miss a dose. Prior blood work results are reviewed with patient. Patient continues to work on fat intake in diet  Results for orders placed or performed in visit on 02/10/19  POCT glycosylated hemoglobin (Hb A1C)  Result Value Ref Range   Hemoglobin A1C 5.9 (A) 4.0 - 5.6 %   HbA1c POC (<> result, manual entry)     HbA1c, POC (prediabetic range)     HbA1c, POC (controlled diabetic range)    HM DIABETES EYE EXAM  Result Value Ref Range   HM Diabetic Eye Exam No Retinopathy No Retinopathy    Did not do labs Review of Systems  Constitutional: Negative for activity change, appetite change and fever.  HENT: Negative for congestion and rhinorrhea.   Eyes: Negative for discharge.  Respiratory: Negative for cough and wheezing.   Cardiovascular: Negative for chest pain.   Gastrointestinal: Negative for abdominal pain, blood in stool and vomiting.  Genitourinary: Negative for difficulty urinating and frequency.  Musculoskeletal: Negative for neck pain.  Skin: Negative for rash.  Allergic/Immunologic: Negative for environmental allergies and food allergies.  Neurological: Negative for weakness and headaches.  Psychiatric/Behavioral: Negative for agitation.  All other systems reviewed and are negative.      Objective:   Physical Exam Vitals signs reviewed.  Constitutional:      Appearance: He is well-developed.  HENT:     Head: Normocephalic and atraumatic.     Right Ear: External ear normal.     Left Ear: External ear normal.     Nose: Nose normal.  Eyes:     Pupils: Pupils are equal, round, and reactive to light.  Neck:     Musculoskeletal: Normal range of motion and neck supple.     Thyroid: No thyromegaly.  Cardiovascular:     Rate and Rhythm: Normal rate and regular rhythm.     Heart sounds: Normal heart sounds. No murmur.  Pulmonary:     Effort: Pulmonary effort is normal. No respiratory distress.     Breath sounds: Normal breath sounds. No wheezing.  Abdominal:     General: Bowel sounds are normal. There is no distension.     Palpations: Abdomen is soft. There is no mass.     Tenderness: There is no abdominal tenderness.  Genitourinary:    Penis: Normal.   Musculoskeletal: Normal range of motion.  Lymphadenopathy:  Cervical: No cervical adenopathy.  Skin:    General: Skin is warm and dry.     Findings: No erythema.  Neurological:     Mental Status: He is alert.     Motor: No abnormal muscle tone.  Psychiatric:        Behavior: Behavior normal.        Judgment: Judgment normal.    Diabetic foot exam sensation intact pulses excellent       Assessment & Plan:  Impression wellness exam.  Diet discussed.  Exercise discussed.  Obesity discussed.  Patient will work harder on things.  Vaccines discussed and administered were  appropriate Utd on colonoscopy  2.  Type 2 diabetes.  A1c excellent diet discussed compliance with medicines discussed  3.  Hypertension good control discussed to maintain same meds  4.  Hyperlipidemia appropriate blood work recommended

## 2019-08-12 LAB — BASIC METABOLIC PANEL
BUN/Creatinine Ratio: 11 (ref 9–20)
BUN: 10 mg/dL (ref 6–24)
CO2: 25 mmol/L (ref 20–29)
Calcium: 9.2 mg/dL (ref 8.7–10.2)
Chloride: 99 mmol/L (ref 96–106)
Creatinine, Ser: 0.87 mg/dL (ref 0.76–1.27)
GFR calc Af Amer: 116 mL/min/{1.73_m2} (ref >59)
GFR calc non Af Amer: 100 mL/min/{1.73_m2} (ref >59)
Glucose: 142 mg/dL — ABNORMAL HIGH (ref 65–99)
Potassium: 4.3 mmol/L (ref 3.5–5.2)
Sodium: 137 mmol/L (ref 134–144)

## 2019-08-12 LAB — LIPID PANEL
Chol/HDL Ratio: 5 ratio (ref 0.0–5.0)
Cholesterol, Total: 159 mg/dL (ref 100–199)
HDL: 32 mg/dL — ABNORMAL LOW (ref >39)
LDL Calculated: 100 mg/dL — ABNORMAL HIGH (ref 0–99)
Triglycerides: 137 mg/dL (ref 0–149)
VLDL Cholesterol Cal: 27 mg/dL (ref 5–40)

## 2019-08-12 LAB — HEMOGLOBIN A1C
Est. average glucose Bld gHb Est-mCnc: 174 mg/dL
Hgb A1c MFr Bld: 7.7 % — ABNORMAL HIGH (ref 4.8–5.6)

## 2019-08-12 LAB — MICROALBUMIN / CREATININE URINE RATIO
Creatinine, Urine: 224.6 mg/dL
Microalb/Creat Ratio: 31 mg/g{creat} — ABNORMAL HIGH (ref 0–29)
Microalbumin, Urine: 69.3 ug/mL

## 2019-08-12 LAB — PSA: Prostate Specific Ag, Serum: 1.6 ng/mL (ref 0.0–4.0)

## 2019-08-12 LAB — HEPATIC FUNCTION PANEL
ALT: 36 IU/L (ref 0–44)
AST: 37 IU/L (ref 0–40)
Albumin: 4.3 g/dL (ref 3.8–4.9)
Alkaline Phosphatase: 84 IU/L (ref 39–117)
Bilirubin Total: <0.2 mg/dL (ref 0.0–1.2)
Bilirubin, Direct: 0.07 mg/dL (ref 0.00–0.40)
Total Protein: 7.8 g/dL (ref 6.0–8.5)

## 2019-08-17 ENCOUNTER — Encounter: Payer: Self-pay | Admitting: Family Medicine

## 2019-10-22 DIAGNOSIS — Z23 Encounter for immunization: Secondary | ICD-10-CM | POA: Diagnosis not present

## 2020-01-28 ENCOUNTER — Encounter: Payer: Self-pay | Admitting: Family Medicine

## 2020-02-11 ENCOUNTER — Ambulatory Visit (INDEPENDENT_AMBULATORY_CARE_PROVIDER_SITE_OTHER): Payer: BC Managed Care – PPO | Admitting: Family Medicine

## 2020-02-11 ENCOUNTER — Other Ambulatory Visit: Payer: Self-pay

## 2020-02-11 DIAGNOSIS — Z79899 Other long term (current) drug therapy: Secondary | ICD-10-CM | POA: Diagnosis not present

## 2020-02-11 DIAGNOSIS — E119 Type 2 diabetes mellitus without complications: Secondary | ICD-10-CM | POA: Diagnosis not present

## 2020-02-11 DIAGNOSIS — I10 Essential (primary) hypertension: Secondary | ICD-10-CM | POA: Diagnosis not present

## 2020-02-11 DIAGNOSIS — E785 Hyperlipidemia, unspecified: Secondary | ICD-10-CM

## 2020-02-11 MED ORDER — GLIPIZIDE 5 MG PO TABS: 10.0000 mg | ORAL_TABLET | Freq: Two times a day (BID) | ORAL | 1 refills | Status: DC

## 2020-02-11 MED ORDER — METFORMIN HCL 500 MG PO TABS: ORAL_TABLET | ORAL | 5 refills | Status: DC

## 2020-02-11 MED ORDER — DILTIAZEM HCL ER BEADS 420 MG PO CP24: 420.0000 mg | ORAL_CAPSULE | Freq: Every day | ORAL | 5 refills | Status: DC

## 2020-02-11 MED ORDER — ATORVASTATIN CALCIUM 20 MG PO TABS: 20.0000 mg | ORAL_TABLET | Freq: Every day | ORAL | 5 refills | Status: DC

## 2020-02-11 MED ORDER — BENAZEPRIL-HYDROCHLOROTHIAZIDE 20-25 MG PO TABS: 1.0000 | ORAL_TABLET | Freq: Every day | ORAL | 5 refills | Status: DC

## 2020-02-11 NOTE — Progress Notes (Signed)
   Subjective:  Audio only  Patient ID: Caleb Ballard, male    DOB: 23-Feb-1966, 54 y.o.   MRN: [id number]  Hypertension This is a chronic problem. Risk factors for coronary artery disease include diabetes mellitus. There are no compliance problems.   Diabetes He presents for his follow-up diabetic visit. He has type 2 diabetes mellitus. There are no hypoglycemic associated symptoms. There are no diabetic associated symptoms. There are no hypoglycemic complications. There are no diabetic complications. Risk factors for coronary artery disease include hypertension. He is compliant with treatment all of the time. Diabetic current diet: trying to be healthy  Exercise: has been but had to stop due to COVID and weather. He does not see a podiatrist.Eye exam is current.    Virtual Visit via Telephone Note  I connected with CRISTINO BEDORE on 02/11/20 at  9:30 AM EST by telephone and verified that I am speaking with the correct person using two identifiers.  Location: Patient: home Provider: office   I discussed the limitations, risks, security and privacy concerns of performing an evaluation and management service by telephone and the availability of in person appointments. I also discussed with the patient that there may be a patient responsible charge related to this service. The patient expressed understanding and agreed to proceed.   History of Present Illness:    Observations/Objective:   Assessment and Plan:   Follow Up Instructions:    I discussed the assessment and treatment plan with the patient. The patient was provided an opportunity to ask questions and all were answered. The patient agreed with the plan and demonstrated an understanding of the instructions.   The patient was advised to call back or seek an in-person evaluation if the symptoms worsen or if the condition fails to improve as anticipated.  I provided 24 minutes of non-face-to-face time during this  encounter.   Patient claims compliance with diabetes medication. No obvious side effects. Reports no substantial low sugar spells. Most numbers are generally in good range when checked fasting. Generally does not miss a dose of medication. Watching diabetic diet closely  Blood pressure medicine and blood pressure levels reviewed today with patient. Compliant with blood pressure medicine. States does not miss a dose. No obvious side effects. Blood pressure generally good when checked elsewhere. Watching salt intake.   Patient continues to take lipid medication regularly. No obvious side effects from it. Generally does not miss a dose. Prior blood work results are reviewed with patient. Patient continues to work on fat intake in diet  cks b p      Review of Systems No headache, no major weight loss or weight gain, no chest pain no back pain abdominal pain no change in bowel habits complete ROS otherwise negative     Objective:   Physical Exam  Virtual      Assessment & Plan:   impression type 2 diabetes exact control uncertain recommend [id number] discussed  2.  Hyperlipidemia exact control uncertain lipid profile recommended  3.  Hypertension blood pressure is good when checked elsewhere  Diet exercise discussed medications refilled appropriate blood work follow-up in 6 months

## 2020-02-17 ENCOUNTER — Other Ambulatory Visit: Payer: Self-pay | Admitting: Family Medicine

## 2020-02-26 ENCOUNTER — Ambulatory Visit: Payer: Self-pay | Attending: Internal Medicine

## 2020-02-26 DIAGNOSIS — Z23 Encounter for immunization: Secondary | ICD-10-CM | POA: Insufficient documentation

## 2020-02-26 NOTE — Progress Notes (Signed)
   Covid-19 Vaccination Clinic  Name:  ROSSER SHOOK    MRN: [id number] DOB: 1966/01/11  02/26/2020  Mr. Rippeon was observed post Covid-19 immunization for 15 minutes without incident. He was provided with Vaccine Information Sheet and instruction to access the V-Safe system.   Mr. Zilch was instructed to call 911 with any severe reactions post vaccine: Marland Kitchen Difficulty breathing  . Swelling of face and throat  . A fast heartbeat  . A bad rash all over body  . Dizziness and weakness   Immunizations Administered    Name Date Dose VIS Date Route   Moderna COVID-19 Vaccine 02/26/2020  8:13 AM 0.5 mL 11/25/2019 Intramuscular   Manufacturer: Moderna   Lot: [id number]   [id number]

## 2020-03-30 ENCOUNTER — Ambulatory Visit: Payer: Self-pay | Attending: Internal Medicine

## 2020-03-30 DIAGNOSIS — Z23 Encounter for immunization: Secondary | ICD-10-CM

## 2020-03-30 NOTE — Progress Notes (Signed)
   Covid-19 Vaccination Clinic  Name:  Caleb Ballard    MRN: [id number] DOB: 07-12-1966  03/30/2020  Mr. Caleb Ballard was observed post Covid-19 immunization for 15 minutes without incident. He was provided with Vaccine Information Sheet and instruction to access the V-Safe system.   Mr. Caleb Ballard was instructed to call 911 with any severe reactions post vaccine: Marland Kitchen Difficulty breathing  . Swelling of face and throat  . A fast heartbeat  . A bad rash all over body  . Dizziness and weakness   Immunizations Administered    Name Date Dose VIS Date Route   Moderna COVID-19 Vaccine 03/30/2020  8:17 AM 0.5 mL 11/25/2019 Intramuscular   Manufacturer: Moderna   LotMV:[id number]   [place]:[id number]

## 2020-03-31 DIAGNOSIS — Z79899 Other long term (current) drug therapy: Secondary | ICD-10-CM | POA: Diagnosis not present

## 2020-03-31 DIAGNOSIS — I10 Essential (primary) hypertension: Secondary | ICD-10-CM | POA: Diagnosis not present

## 2020-03-31 DIAGNOSIS — E785 Hyperlipidemia, unspecified: Secondary | ICD-10-CM | POA: Diagnosis not present

## 2020-03-31 DIAGNOSIS — E119 Type 2 diabetes mellitus without complications: Secondary | ICD-10-CM | POA: Diagnosis not present

## 2020-04-01 LAB — HEPATIC FUNCTION PANEL
ALT: 50 IU/L — ABNORMAL HIGH (ref 0–44)
AST: 51 IU/L — ABNORMAL HIGH (ref 0–40)
Albumin: 4.3 g/dL (ref 3.8–4.9)
Alkaline Phosphatase: 90 IU/L (ref 39–117)
Bilirubin Total: 0.2 mg/dL (ref 0.0–1.2)
Bilirubin, Direct: 0.09 mg/dL (ref 0.00–0.40)
Total Protein: 7.5 g/dL (ref 6.0–8.5)

## 2020-04-01 LAB — HEMOGLOBIN A1C
Est. average glucose Bld gHb Est-mCnc: 163 mg/dL
Hgb A1c MFr Bld: 7.3 % — ABNORMAL HIGH (ref 4.8–5.6)

## 2020-04-01 LAB — LIPID PANEL
Chol/HDL Ratio: 5.6 ratio — ABNORMAL HIGH (ref 0.0–5.0)
Cholesterol, Total: 169 mg/dL (ref 100–199)
HDL: 30 mg/dL — ABNORMAL LOW (ref >39)
LDL Chol Calc (NIH): 113 mg/dL — ABNORMAL HIGH (ref 0–99)
Triglycerides: 143 mg/dL (ref 0–149)
VLDL Cholesterol Cal: 26 mg/dL (ref 5–40)

## 2020-04-06 ENCOUNTER — Encounter: Payer: Self-pay | Admitting: Family Medicine

## 2020-04-09 ENCOUNTER — Other Ambulatory Visit: Payer: Self-pay | Admitting: Family Medicine

## 2020-06-30 ENCOUNTER — Other Ambulatory Visit: Payer: Self-pay | Admitting: Family Medicine

## 2020-06-30 NOTE — Telephone Encounter (Signed)
Med check up 02/11/20

## 2020-06-30 NOTE — Telephone Encounter (Signed)
This +1 refill needs follow-up visit with Dr. Lovena Le

## 2020-07-01 NOTE — Telephone Encounter (Signed)
Scheduled 8/24

## 2020-07-26 ENCOUNTER — Telehealth: Payer: Self-pay | Admitting: Family Medicine

## 2020-07-26 MED ORDER — GLIPIZIDE 5 MG PO TABS: ORAL_TABLET | ORAL | 0 refills | Status: DC

## 2020-07-26 NOTE — Addendum Note (Signed)
Addended by: Dairl Ponder on: 07/26/2020 04:22 PM   Modules accepted: Orders

## 2020-07-26 NOTE — Telephone Encounter (Signed)
Pt is needing refill on glipiZIDE (GLUCOTROL) 5 MG tablet   Has est care appt on 8/24.  [id number] - [place], [address] AT [address]

## 2020-07-26 NOTE — Telephone Encounter (Addendum)
Prescription sent electronically to pharmacy. Patient notified. 

## 2020-08-17 ENCOUNTER — Encounter: Payer: Self-pay | Admitting: Family Medicine

## 2020-08-17 ENCOUNTER — Ambulatory Visit: Payer: BC Managed Care – PPO | Admitting: Family Medicine

## 2020-08-17 ENCOUNTER — Other Ambulatory Visit: Payer: Self-pay

## 2020-08-17 VITALS — BP 142/90 | HR 108 | Temp 96.8°F | Ht 73.0 in | Wt 307.0 lb

## 2020-08-17 DIAGNOSIS — R7989 Other specified abnormal findings of blood chemistry: Secondary | ICD-10-CM | POA: Diagnosis not present

## 2020-08-17 DIAGNOSIS — E119 Type 2 diabetes mellitus without complications: Secondary | ICD-10-CM

## 2020-08-17 DIAGNOSIS — E785 Hyperlipidemia, unspecified: Secondary | ICD-10-CM

## 2020-08-17 DIAGNOSIS — I10 Essential (primary) hypertension: Secondary | ICD-10-CM

## 2020-08-17 MED ORDER — BENAZEPRIL-HYDROCHLOROTHIAZIDE 20-25 MG PO TABS: 1.0000 | ORAL_TABLET | Freq: Every day | ORAL | 1 refills | Status: DC

## 2020-08-17 MED ORDER — ATORVASTATIN CALCIUM 20 MG PO TABS: 20.0000 mg | ORAL_TABLET | Freq: Every day | ORAL | 1 refills | Status: DC

## 2020-08-17 MED ORDER — DILTIAZEM HCL ER BEADS 420 MG PO CP24: 420.0000 mg | ORAL_CAPSULE | Freq: Every day | ORAL | 1 refills | Status: DC

## 2020-08-17 NOTE — Progress Notes (Signed)
Patient ID: Caleb Ballard, male    DOB: Mar 03, 1966, 54 y.o.   MRN: [id number]   Chief Complaint  Patient presents with  . Establish Care  . Diabetes  . Hypertension   Subjective:    HPI Pt here to establish care. Pt states he does not need refills at this time. No issues or concerns.   HTN- stating taking both bp meds at night. No chest pain, sob, LE edema. Pt stating compliant with meds.  DM2- doing well with meds.  Checking fasting bg getting 120-130s.  Not exercising as much as used to. Pt stating compliant with meds.  HLD- no SE from meds. Pt stating is compliant with meds.  Medical History Caleb Ballard has a past medical history of Diabetes mellitus without complication ([place]), Hyperlipidemia, Hypertension, and Ruptured lumbar disc.   Outpatient Encounter Medications as of 08/17/2020  Medication Sig  . atorvastatin (LIPITOR) 20 MG tablet Take 1 tablet (20 mg total) by mouth at bedtime.  Marland Kitchen BAYER CONTOUR NEXT TEST test strip TEST ONCE DAILY AS DIRECTED  . benazepril-hydrochlorthiazide (LOTENSIN HCT) 20-25 MG tablet Take 1 tablet by mouth daily.  . blood glucose meter kit and supplies KIT Dispense based on patient and insurance preference. Test blood sugar once daily. Diabetes type E11.9  . diltiazem (TIAZAC) 420 MG 24 hr capsule Take 1 capsule (420 mg total) by mouth daily.  Marland Kitchen glipiZIDE (GLUCOTROL) 5 MG tablet TAKE 2 TABLETS(10 MG) BY MOUTH TWICE DAILY  . metFORMIN (GLUCOPHAGE) 500 MG tablet TAKE 2 TABLETS BY MOUTH TWICE DAILY WITH FOOD  . OVER THE COUNTER MEDICATION Asprin 81 mg daily.  . [DISCONTINUED] atorvastatin (LIPITOR) 20 MG tablet Take 1 tablet (20 mg total) by mouth at bedtime.  . [DISCONTINUED] benazepril-hydrochlorthiazide (LOTENSIN HCT) 20-25 MG tablet Take 1 tablet by mouth daily.  . [DISCONTINUED] diltiazem (TIAZAC) 420 MG 24 hr capsule TAKE ONE CAPSULE BY MOUTH ONCE DAILY   No facility-administered encounter medications on file as of 08/17/2020.      Review of Systems  Constitutional: Negative for chills and fever.  HENT: Negative for congestion, rhinorrhea and sore throat.   Respiratory: Negative for cough, shortness of breath and wheezing.   Cardiovascular: Negative for chest pain and leg swelling.  Gastrointestinal: Negative for abdominal pain, diarrhea, nausea and vomiting.  Genitourinary: Negative for dysuria and frequency.  Skin: Negative for rash.  Neurological: Negative for dizziness, weakness and headaches.     Vitals BP (!) 142/90   Pulse (!) 108   Temp (!) 96.8 F (36 C)   Ht $R'6\' 1"'XE$  (1.854 m)   Wt (!) 307 lb (139.3 kg)   SpO2 97%   BMI 40.50 kg/m   Objective:   Physical Exam Vitals and nursing note reviewed.  Constitutional:      General: He is not in acute distress.    Appearance: Normal appearance. He is not ill-appearing.  HENT:     Head: Normocephalic.  Eyes:     Extraocular Movements: Extraocular movements intact.     Conjunctiva/sclera: Conjunctivae normal.     Pupils: Pupils are equal, round, and reactive to light.  Cardiovascular:     Rate and Rhythm: Regular rhythm. Tachycardia present.     Pulses: Normal pulses.     Heart sounds: Normal heart sounds. No murmur heard.   Pulmonary:     Effort: Pulmonary effort is normal. No respiratory distress.     Breath sounds: Normal breath sounds. No wheezing, rhonchi or rales.  Musculoskeletal:  General: Normal range of motion.     Right lower leg: No edema.     Left lower leg: No edema.  Skin:    General: Skin is warm and dry.     Findings: No rash.  Neurological:     General: No focal deficit present.     Mental Status: He is alert and oriented to person, place, and time.     Cranial Nerves: No cranial nerve deficit.  Psychiatric:        Mood and Affect: Mood normal.        Behavior: Behavior normal.        Thought Content: Thought content normal.        Judgment: Judgment normal.    Assessment and Plan   1. Type 2 diabetes  mellitus without complication, without long-term current use of insulin ([place])  2. Hyperlipidemia LDL goal <100  3. Essential hypertension  4. Elevated LFTs   Dm2- improved and stable.  Pt had a1c in 4/21- was at 7.3.  Checking bg seeing 120-130s. Hasn't been exercising as much.  Slight elevated liver enzymes. Will cont to monitor and recheck next visit.  htn- uncontrolled. bp was 170/100 on recheck was 142/90 and HR at 108 bpm. Doesn't have bp cuff at home.   Change to taking the diltiazem in am and the benazepril at night.   HLD- last labs stable.  Cont meds.  Follow up in 4 wk for recheck BP and HR.

## 2020-08-17 NOTE — Patient Instructions (Signed)
Take diltiazem ER 420mg  take in AM. Take benazepril at pm.

## 2020-09-21 ENCOUNTER — Ambulatory Visit: Payer: BC Managed Care – PPO | Admitting: Family Medicine

## 2020-09-21 ENCOUNTER — Other Ambulatory Visit: Payer: Self-pay

## 2020-09-21 ENCOUNTER — Encounter: Payer: Self-pay | Admitting: Family Medicine

## 2020-09-21 VITALS — BP 130/90 | HR 95 | Temp 97.5°F | Ht 73.0 in | Wt 305.0 lb

## 2020-09-21 DIAGNOSIS — R000 Tachycardia, unspecified: Secondary | ICD-10-CM | POA: Diagnosis not present

## 2020-09-21 DIAGNOSIS — I10 Essential (primary) hypertension: Secondary | ICD-10-CM

## 2020-09-21 MED ORDER — BENAZEPRIL HCL 10 MG PO TABS: 10.0000 mg | ORAL_TABLET | Freq: Every day | ORAL | 1 refills | Status: DC

## 2020-09-21 NOTE — Patient Instructions (Signed)
Take 10mg  benzapril in am with diltiazem.  At night take the benzapril/hctz as usual.

## 2020-09-21 NOTE — Progress Notes (Signed)
Patient ID: Caleb Ballard, male    DOB: 21-Apr-1966, 54 y.o.   MRN: [id number]   Chief Complaint  Patient presents with  . Hypertension   Subjective:    HPI  Recheck BP and heart rate.  On last visit bp and hr were elevated.  Last visit in 8/21- had elevated bp at 142/90 and Hr 108.   This time bp up and HR in 90s. No chest pain, sob, dizziness.   Medical History Caleb Ballard has a past medical history of Diabetes mellitus without complication ([place]), Hyperlipidemia, Hypertension, and Ruptured lumbar disc.   Outpatient Encounter Medications as of 09/21/2020  Medication Sig  . atorvastatin (LIPITOR) 20 MG tablet Take 1 tablet (20 mg total) by mouth at bedtime.  Marland Kitchen BAYER CONTOUR NEXT TEST test strip TEST ONCE DAILY AS DIRECTED  . benazepril-hydrochlorthiazide (LOTENSIN HCT) 20-25 MG tablet Take 1 tablet by mouth daily.  . blood glucose meter kit and supplies KIT Dispense based on patient and insurance preference. Test blood sugar once daily. Diabetes type E11.9  . diltiazem (TIAZAC) 420 MG 24 hr capsule Take 1 capsule (420 mg total) by mouth daily.  Marland Kitchen glipiZIDE (GLUCOTROL) 5 MG tablet TAKE 2 TABLETS(10 MG) BY MOUTH TWICE DAILY  . metFORMIN (GLUCOPHAGE) 500 MG tablet TAKE 2 TABLETS BY MOUTH TWICE DAILY WITH FOOD  . OVER THE COUNTER MEDICATION Asprin 81 mg daily.  . benazepril (LOTENSIN) 10 MG tablet Take 1 tablet (10 mg total) by mouth daily.   No facility-administered encounter medications on file as of 09/21/2020.     Review of Systems  Constitutional: Negative for chills and fever.  HENT: Negative for congestion, rhinorrhea and sore throat.   Respiratory: Negative for cough, shortness of breath and wheezing.   Cardiovascular: Negative for chest pain and leg swelling.  Gastrointestinal: Negative for abdominal pain, diarrhea, nausea and vomiting.  Genitourinary: Negative for dysuria and frequency.  Skin: Negative for rash.  Neurological: Negative for dizziness, weakness and  headaches.     Vitals BP 130/90   Pulse 95   Temp (!) 97.5 F (36.4 C)   Ht $R'6\' 1"'tG$  (1.854 m)   Wt (!) 305 lb (138.3 kg)   SpO2 96%   BMI 40.24 kg/m   Objective:   Physical Exam Vitals and nursing note reviewed.  Constitutional:      General: He is not in acute distress.    Appearance: Normal appearance. He is not ill-appearing.  HENT:     Head: Normocephalic.     Nose: Nose normal. No congestion.     Mouth/Throat:     Mouth: Mucous membranes are moist.     Pharynx: No oropharyngeal exudate.  Eyes:     Extraocular Movements: Extraocular movements intact.     Conjunctiva/sclera: Conjunctivae normal.     Pupils: Pupils are equal, round, and reactive to light.  Cardiovascular:     Rate and Rhythm: Normal rate and regular rhythm.     Pulses: Normal pulses.     Heart sounds: Normal heart sounds. No murmur heard.   Pulmonary:     Effort: Pulmonary effort is normal.     Breath sounds: Normal breath sounds. No wheezing, rhonchi or rales.  Musculoskeletal:        General: Normal range of motion.     Right lower leg: No edema.     Left lower leg: No edema.  Skin:    General: Skin is warm and dry.     Findings: No rash.  Neurological:     General: No focal deficit present.     Mental Status: He is alert and oriented to person, place, and time.     Cranial Nerves: No cranial nerve deficit.  Psychiatric:        Mood and Affect: Mood normal.        Behavior: Behavior normal.        Thought Content: Thought content normal.        Judgment: Judgment normal.      Assessment and Plan   1. Essential hypertension  2. Tachycardia   htn-uncontrolled. Initially on exam 144/76; On recheck bp was 130/90. Added $RemoveBefor'10mg'ihqLunIGMIHe$  benzapril in Am to take with diltizem and take at night the $RemoveB'20mg'cOaBgEyR$ /$R'25mg'oT$  benzapril/hctz.  Decrease salt intake.  Tachycardia- improved.  Cont to monitor.  Pt in agreement.  F/u 39mo prn.

## 2020-10-02 ENCOUNTER — Encounter: Payer: Self-pay | Admitting: Family Medicine

## 2020-11-22 ENCOUNTER — Other Ambulatory Visit: Payer: Self-pay | Admitting: Family Medicine

## 2020-12-19 ENCOUNTER — Other Ambulatory Visit: Payer: Self-pay | Admitting: Family Medicine

## 2021-01-18 ENCOUNTER — Other Ambulatory Visit: Payer: Self-pay | Admitting: Family Medicine

## 2021-02-10 ENCOUNTER — Telehealth: Payer: Self-pay

## 2021-02-10 DIAGNOSIS — I10 Essential (primary) hypertension: Secondary | ICD-10-CM

## 2021-02-10 DIAGNOSIS — Z79899 Other long term (current) drug therapy: Secondary | ICD-10-CM

## 2021-02-10 DIAGNOSIS — E785 Hyperlipidemia, unspecified: Secondary | ICD-10-CM

## 2021-02-10 DIAGNOSIS — E119 Type 2 diabetes mellitus without complications: Secondary | ICD-10-CM

## 2021-02-10 DIAGNOSIS — Z125 Encounter for screening for malignant neoplasm of prostate: Secondary | ICD-10-CM

## 2021-02-10 NOTE — Telephone Encounter (Signed)
Pt has Phy on 02/28 needs blood work ordered  Pt call back [phone]

## 2021-02-10 NOTE — Telephone Encounter (Signed)
Last labs completed 03/31/20 A1C, Hepatic and Lipid. Please advise. Thank you

## 2021-02-16 NOTE — Telephone Encounter (Signed)
Yes pls order and add psa. Thx. D.r taylor

## 2021-02-16 NOTE — Telephone Encounter (Signed)
Left message to return call. Lab orders placed

## 2021-02-17 ENCOUNTER — Other Ambulatory Visit: Payer: Self-pay | Admitting: Family Medicine

## 2021-02-18 DIAGNOSIS — E785 Hyperlipidemia, unspecified: Secondary | ICD-10-CM | POA: Diagnosis not present

## 2021-02-18 DIAGNOSIS — Z125 Encounter for screening for malignant neoplasm of prostate: Secondary | ICD-10-CM | POA: Diagnosis not present

## 2021-02-18 DIAGNOSIS — E119 Type 2 diabetes mellitus without complications: Secondary | ICD-10-CM | POA: Diagnosis not present

## 2021-02-18 DIAGNOSIS — I10 Essential (primary) hypertension: Secondary | ICD-10-CM | POA: Diagnosis not present

## 2021-02-18 DIAGNOSIS — Z79899 Other long term (current) drug therapy: Secondary | ICD-10-CM | POA: Diagnosis not present

## 2021-02-18 NOTE — Telephone Encounter (Signed)
Pt returned call and verbalized understanding. Pt has lab work done this morning

## 2021-02-18 NOTE — Telephone Encounter (Signed)
Left message to return call 

## 2021-02-18 NOTE — Telephone Encounter (Signed)
Lab Results  Component Value Date   HGBA1C 7.3 (H) 03/31/2020    Lab Results  Component Value Date   CREATININE 0.87 08/11/2019     Lab Results  Component Value Date   CHOL 169 03/31/2020   HDL 30 (L) 03/31/2020   LDLCALC 113 (H) 03/31/2020   TRIG 143 03/31/2020   CHOLHDL 5.6 (H) 03/31/2020     BP Readings from Last 3 Encounters:  09/21/20 130/90  08/17/20 (!) 142/90  08/11/19 (!) 142/86

## 2021-02-19 LAB — PSA: Prostate Specific Ag, Serum: 1.6 ng/mL (ref 0.0–4.0)

## 2021-02-19 LAB — LIPID PANEL
Chol/HDL Ratio: 5.9 ratio — ABNORMAL HIGH (ref 0.0–5.0)
Cholesterol, Total: 189 mg/dL (ref 100–199)
HDL: 32 mg/dL — ABNORMAL LOW (ref >39)
LDL Chol Calc (NIH): 120 mg/dL — ABNORMAL HIGH (ref 0–99)
Triglycerides: 207 mg/dL — ABNORMAL HIGH (ref 0–149)
VLDL Cholesterol Cal: 37 mg/dL (ref 5–40)

## 2021-02-19 LAB — HEMOGLOBIN A1C
Est. average glucose Bld gHb Est-mCnc: 240 mg/dL
Hgb A1c MFr Bld: 10 % — ABNORMAL HIGH (ref 4.8–5.6)

## 2021-02-19 LAB — HEPATIC FUNCTION PANEL
ALT: 55 IU/L — ABNORMAL HIGH (ref 0–44)
AST: 64 IU/L — ABNORMAL HIGH (ref 0–40)
Albumin: 4.1 g/dL (ref 3.8–4.9)
Alkaline Phosphatase: 111 IU/L (ref 44–121)
Bilirubin Total: 0.2 mg/dL (ref 0.0–1.2)
Bilirubin, Direct: <0.1 mg/dL (ref 0.00–0.40)
Total Protein: 8 g/dL (ref 6.0–8.5)

## 2021-02-21 ENCOUNTER — Encounter: Payer: Self-pay | Admitting: Family Medicine

## 2021-02-21 ENCOUNTER — Other Ambulatory Visit: Payer: Self-pay

## 2021-02-21 ENCOUNTER — Ambulatory Visit (INDEPENDENT_AMBULATORY_CARE_PROVIDER_SITE_OTHER): Payer: BC Managed Care – PPO | Admitting: Family Medicine

## 2021-02-21 VITALS — BP 160/90 | HR 101 | Temp 97.1°F | Ht 72.0 in | Wt 302.2 lb

## 2021-02-21 DIAGNOSIS — E119 Type 2 diabetes mellitus without complications: Secondary | ICD-10-CM

## 2021-02-21 DIAGNOSIS — I10 Essential (primary) hypertension: Secondary | ICD-10-CM | POA: Diagnosis not present

## 2021-02-21 DIAGNOSIS — E785 Hyperlipidemia, unspecified: Secondary | ICD-10-CM | POA: Diagnosis not present

## 2021-02-21 DIAGNOSIS — Z0000 Encounter for general adult medical examination without abnormal findings: Secondary | ICD-10-CM

## 2021-02-21 MED ORDER — NOVOFINE PEN NEEDLE 32G X 6 MM MISC: 2 refills | Status: DC

## 2021-02-21 MED ORDER — BENAZEPRIL-HYDROCHLOROTHIAZIDE 20-25 MG PO TABS: 1.0000 | ORAL_TABLET | Freq: Every day | ORAL | 0 refills | Status: DC

## 2021-02-21 MED ORDER — LANTUS SOLOSTAR 100 UNIT/ML ~~LOC~~ SOPN: 10.0000 [IU] | PEN_INJECTOR | Freq: Every day | SUBCUTANEOUS | 1 refills | Status: DC

## 2021-02-21 MED ORDER — ATORVASTATIN CALCIUM 20 MG PO TABS: 20.0000 mg | ORAL_TABLET | Freq: Every day | ORAL | 1 refills | Status: DC

## 2021-02-21 MED ORDER — GLIPIZIDE 5 MG PO TABS
ORAL_TABLET | ORAL | 2 refills | Status: DC
Start: 2021-02-21 — End: 2021-04-19

## 2021-02-21 MED ORDER — DILTIAZEM HCL ER BEADS 420 MG PO CP24
420.0000 mg | ORAL_CAPSULE | Freq: Every day | ORAL | 0 refills | Status: DC
Start: 2021-02-21 — End: 2021-05-24

## 2021-02-21 MED ORDER — BENAZEPRIL HCL 10 MG PO TABS: 10.0000 mg | ORAL_TABLET | Freq: Every day | ORAL | 0 refills | Status: DC

## 2021-02-21 MED ORDER — ALCOHOL PADS 70 % PADS: MEDICATED_PAD | 3 refills | Status: AC

## 2021-02-21 MED ORDER — METFORMIN HCL 500 MG PO TABS
ORAL_TABLET | ORAL | 1 refills | Status: DC
Start: 2021-02-21 — End: 2021-05-18

## 2021-02-21 NOTE — Patient Instructions (Signed)
80-140- is okay for fasting blood glucose  Call if seeing under 70 or over 250.    Call if having any issues with lantus.

## 2021-02-21 NOTE — Progress Notes (Signed)
Patient ID: Caleb Ballard, male    DOB: [birth year], 55 y.o.   MRN: [id number]   Chief Complaint  Patient presents with  . Annual Exam   Subjective:    HPI The patient comes in today for a wellness visit.  A review of their health history was completed.  A review of medications was also completed.  Any needed refills; on all chronic meds.   Eating habits: healthy at times  Falls/  MVA accidents in past few months: none  Regular exercise: walks  Specialist pt sees on regular basis: none  Preventative health issues were discussed.   Additional concerns: none  Pt feeling well.  Pt stating having bp taken and very nervous today.  Taking one bp in am and 2 at night.  dm2- Eye exam and foot exam. Overdue for eye exam. No sores on feet or burning pain.  Not been walking much in past year.   LFTs elevated.  Tg elevated 207 a1c at 10, last 4/21 was 7.3.   BP Readings from Last 3 Encounters:  09/21/20 130/90  08/17/20 (!) 142/90  08/11/19 (!) 142/86   Less exercising.  Not missing any meds with diabetes.   Medical History Murphy has a past medical history of Diabetes mellitus without complication ([place]), Hyperlipidemia, Hypertension, and Ruptured lumbar disc.   Outpatient Encounter Medications as of 02/21/2021  Medication Sig  . Alcohol Swabs (ALCOHOL PADS) 70 % PADS Use as directed.  Marland Kitchen BAYER CONTOUR NEXT TEST test strip TEST ONCE DAILY AS DIRECTED  . blood glucose meter kit and supplies KIT Dispense based on patient and insurance preference. Test blood sugar once daily. Diabetes type E11.9  . insulin glargine (LANTUS SOLOSTAR) 100 UNIT/ML Solostar Pen Inject 10 Units into the skin daily.  . Insulin Pen Needle (NOVOFINE PEN NEEDLE) 32G X 6 MM MISC Use as directed with lantus pen daily.  Marland Kitchen OVER THE COUNTER MEDICATION Asprin 81 mg daily.  . [DISCONTINUED] atorvastatin (LIPITOR) 20 MG tablet Take 1 tablet (20 mg total) by mouth at bedtime.  . [DISCONTINUED]  benazepril (LOTENSIN) 10 MG tablet Take 1 tablet (10 mg total) by mouth daily.  . [DISCONTINUED] benazepril-hydrochlorthiazide (LOTENSIN HCT) 20-25 MG tablet Take 1 tablet by mouth daily.  . [DISCONTINUED] diltiazem (TIAZAC) 420 MG 24 hr capsule Take 1 capsule (420 mg total) by mouth daily.  . [DISCONTINUED] glipiZIDE (GLUCOTROL) 5 MG tablet TAKE 2 TABLETS(10 MG) BY MOUTH TWICE DAILY  . [DISCONTINUED] metFORMIN (GLUCOPHAGE) 500 MG tablet TAKE 2 TABLETS BY MOUTH TWICE DAILY WITH FOOD  . atorvastatin (LIPITOR) 20 MG tablet Take 1 tablet (20 mg total) by mouth at bedtime.  . benazepril (LOTENSIN) 10 MG tablet Take 1 tablet (10 mg total) by mouth daily.  . benazepril-hydrochlorthiazide (LOTENSIN HCT) 20-25 MG tablet Take 1 tablet by mouth daily.  Marland Kitchen diltiazem (TIAZAC) 420 MG 24 hr capsule Take 1 capsule (420 mg total) by mouth daily.  Marland Kitchen glipiZIDE (GLUCOTROL) 5 MG tablet TAKE 2 TABLETS(10 MG) BY MOUTH TWICE DAILY  . metFORMIN (GLUCOPHAGE) 500 MG tablet Take 2 tab p.o. bid with meals.   No facility-administered encounter medications on file as of 02/21/2021.     Review of Systems  Constitutional: Negative for chills and fever.  HENT: Negative for congestion, rhinorrhea and sore throat.   Respiratory: Negative for cough, shortness of breath and wheezing.   Cardiovascular: Negative for chest pain and leg swelling.  Gastrointestinal: Negative for abdominal pain, diarrhea, nausea and vomiting.  Genitourinary:  Negative for dysuria and frequency.  Skin: Negative for rash.  Neurological: Negative for dizziness, weakness and headaches.     Vitals BP (!) 160/90   Pulse (!) 101   Temp (!) 97.1 F (36.2 C)   Ht 6' (1.829 m)   Wt (!) 302 lb 3.2 oz (137.1 kg)   SpO2 97%   BMI 40.99 kg/m   Objective:   Physical Exam Vitals and nursing note reviewed.  Constitutional:      General: He is not in acute distress.    Appearance: Normal appearance. He is not ill-appearing.  HENT:     Head:  Normocephalic.     Nose: Nose normal. No congestion.     Mouth/Throat:     Mouth: Mucous membranes are moist.     Pharynx: No oropharyngeal exudate.  Eyes:     Extraocular Movements: Extraocular movements intact.     Conjunctiva/sclera: Conjunctivae normal.     Pupils: Pupils are equal, round, and reactive to light.  Cardiovascular:     Rate and Rhythm: Normal rate and regular rhythm.     Pulses: Normal pulses.     Heart sounds: Normal heart sounds. No murmur heard.   Pulmonary:     Effort: Pulmonary effort is normal.     Breath sounds: Normal breath sounds. No wheezing, rhonchi or rales.  Musculoskeletal:        General: Normal range of motion.     Right lower leg: No edema.     Left lower leg: No edema.  Skin:    General: Skin is warm and dry.     Findings: No rash.  Neurological:     General: No focal deficit present.     Mental Status: He is alert and oriented to person, place, and time.     Cranial Nerves: No cranial nerve deficit.  Psychiatric:        Mood and Affect: Mood normal.        Behavior: Behavior normal.        Thought Content: Thought content normal.        Judgment: Judgment normal.      Assessment and Plan   1. Essential hypertension - benazepril-hydrochlorthiazide (LOTENSIN HCT) 20-25 MG tablet; Take 1 tablet by mouth daily.  Dispense: 90 tablet; Refill: 0 - benazepril (LOTENSIN) 10 MG tablet; Take 1 tablet (10 mg total) by mouth daily.  Dispense: 90 tablet; Refill: 0 - diltiazem (TIAZAC) 420 MG 24 hr capsule; Take 1 capsule (420 mg total) by mouth daily.  Dispense: 90 capsule; Refill: 0  2. Hyperlipidemia LDL goal <100 - atorvastatin (LIPITOR) 20 MG tablet; Take 1 tablet (20 mg total) by mouth at bedtime.  Dispense: 90 tablet; Refill: 1  3. Type 2 diabetes mellitus without complication, without long-term current use of insulin (HCC) - insulin glargine (LANTUS SOLOSTAR) 100 UNIT/ML Solostar Pen; Inject 10 Units into the skin daily.  Dispense: 15  mL; Refill: 1 - Alcohol Swabs (ALCOHOL PADS) 70 % PADS; Use as directed.  Dispense: 100 each; Refill: 3 - Insulin Pen Needle (NOVOFINE PEN NEEDLE) 32G X 6 MM MISC; Use as directed with lantus pen daily.  Dispense: 50 each; Refill: 2 - metFORMIN (GLUCOPHAGE) 500 MG tablet; Take 2 tab p.o. bid with meals.  Dispense: 360 tablet; Refill: 1 - glipiZIDE (GLUCOTROL) 5 MG tablet; TAKE 2 TABLETS(10 MG) BY MOUTH TWICE DAILY  Dispense: 120 tablet; Refill: 2   htn- uncontrolled.  Pt stating is nervous today. Reck bp at end  of visit, was the same.   Will refill bp meds and make sure to take daily. Switch to taking benaz/hctz to am and the diltiazem in AM then take benzapril $RemoveBefore'10mg'[id number]$  in pm.  Making sure that pt is taking meds daily.  Will recheck bp in 4 wks, bp. Pt in agreement with plan.  DM2-uncontrolled. a1c at 10.0, last visit 1 yr ago was 7.3. adding lantus, cont with glipizide and metformin.  Recheck in 4wk glucose and meds.   HLD- TG elevated at 207.  LdL 120, cholesterol 189.  Cont to monitor and dec carbs in diet.  Cont meds.   Return in about 4 weeks (around 03/21/2021) for dm, htn, office visit.

## 2021-03-01 ENCOUNTER — Encounter: Payer: Self-pay | Admitting: Family Medicine

## 2021-03-18 ENCOUNTER — Other Ambulatory Visit: Payer: Self-pay | Admitting: Family Medicine

## 2021-03-18 ENCOUNTER — Telehealth: Payer: Self-pay

## 2021-03-18 NOTE — Telephone Encounter (Signed)
Patient was seen 02/21/21 and has appt next week.  Went to pharmacy and they said he doesn't have anymore prescriptions for test strips and he is completely out. Everything else is ok but has no way to test his blood sugar.  He tests twice a day.  [facility] says "one touch  verio test strips".  [facility] on [address]

## 2021-03-18 NOTE — Telephone Encounter (Signed)
Prescription sent electronically to pharmacy. Patient notified. 

## 2021-03-22 ENCOUNTER — Ambulatory Visit: Payer: BC Managed Care – PPO | Admitting: Family Medicine

## 2021-03-22 ENCOUNTER — Other Ambulatory Visit: Payer: Self-pay

## 2021-03-22 ENCOUNTER — Encounter: Payer: Self-pay | Admitting: Family Medicine

## 2021-03-22 VITALS — BP 187/106 | HR 82 | Temp 97.2°F | Ht 72.0 in | Wt 298.0 lb

## 2021-03-22 DIAGNOSIS — E119 Type 2 diabetes mellitus without complications: Secondary | ICD-10-CM

## 2021-03-22 DIAGNOSIS — E785 Hyperlipidemia, unspecified: Secondary | ICD-10-CM

## 2021-03-22 DIAGNOSIS — R7989 Other specified abnormal findings of blood chemistry: Secondary | ICD-10-CM | POA: Diagnosis not present

## 2021-03-22 DIAGNOSIS — I10 Essential (primary) hypertension: Secondary | ICD-10-CM

## 2021-03-22 MED ORDER — ONETOUCH VERIO VI STRP: ORAL_STRIP | 2 refills | Status: DC

## 2021-03-22 NOTE — Patient Instructions (Addendum)
Go home and take 420mg  diltiazem and benazepril/hct 20/25mg  today as soon as get home.   Then tomorrow start taking these together in am and then take the 1-mg benazepril in pm.

## 2021-03-22 NOTE — Progress Notes (Signed)
Patient ID: Caleb Ballard, male    DOB: November 22, 1966, 55 y.o.   MRN: [id number]   Chief Complaint  Patient presents with  . Diabetes  . Hypertension   Subjective:    HPI  CC-med check up on htn and diabetes.   DM2- Needs rx for 100 strips instead of 50 strips. Test twice a day.  Dm2- Compliant with medications. Checking blood glucose.  Seeing better numbers, since starting the 10 units of lantus at night. Not seeing any high or low numbers.  Denies polyuria or polydipsia.  Eye exam: overdue Foot exam: no concerns.  HTN Pt compliant with BP meds.  No SEs Denies chest pain, sob, LE swelling, or blurry vision.  Pt stating he was confused on how to take his bp meds. On last visit gave him extra 25m benazepril. So pt stating didn't take his bp meds correctly today.     Medical History [place], Hyperlipidemia, Hypertension, and Ruptured lumbar disc.   Outpatient Encounter Medications as of 03/22/2021  Medication Sig  . Alcohol Swabs (ALCOHOL PADS) 70 % PADS Use as directed.  .Marland Kitchenatorvastatin (LIPITOR) 20 MG tablet Take 1 tablet (20 mg total) by mouth at bedtime.  . benazepril (LOTENSIN) 10 MG tablet Take 1 tablet (10 mg total) by mouth daily.  . benazepril-hydrochlorthiazide (LOTENSIN HCT) 20-25 MG tablet Take 1 tablet by mouth daily.  . blood glucose meter kit and supplies KIT Dispense based on patient and insurance preference. Test blood sugar once daily. Diabetes type E11.9  . diltiazem (TIAZAC) 420 MG 24 hr capsule Take 1 capsule (420 mg total) by mouth daily.  .Marland KitchenglipiZIDE (GLUCOTROL) 5 MG tablet TAKE 2 TABLETS(10 MG) BY MOUTH TWICE DAILY  . insulin glargine (LANTUS SOLOSTAR) 100 UNIT/ML Solostar Pen Inject 10 Units into the skin daily.  . Insulin Pen Needle (NOVOFINE PEN NEEDLE) 32G X 6 MM MISC Use as directed with lantus pen daily.  . metFORMIN (GLUCOPHAGE) 500 MG tablet Take 2 tab p.o. bid with  meals.  .Marland KitchenOVER THE COUNTER MEDICATION Asprin 81 mg daily.  . [DISCONTINUED] ONETOUCH VERIO test strip TEST ONCE DAILY  . glucose blood (ONETOUCH VERIO) test strip Check blood glucose 2x per day.   No facility-administered encounter medications on file as of 03/22/2021.     Review of Systems  Constitutional: Negative for chills and fever.  HENT: Negative for congestion, rhinorrhea and sore throat.   Respiratory: Negative for cough, shortness of breath and wheezing.   Cardiovascular: Negative for chest pain and leg swelling.  Gastrointestinal: Negative for abdominal pain, diarrhea, nausea and vomiting.  Genitourinary: Negative for dysuria and frequency.  Skin: Negative for rash.  Neurological: Negative for dizziness, weakness and headaches.     Vitals BP (!) 187/106   Pulse 82   Temp (!) 97.2 F (36.2 C)   Ht 6' (1.829 m)   Wt 298 lb (135.2 kg)   SpO2 99%   BMI 40.42 kg/m   Objective:   Physical Exam Vitals and nursing note reviewed.  Constitutional:      General: He is not in acute distress.    Appearance: Normal appearance. He is not ill-appearing.  HENT:     Head: Normocephalic.     Nose: Nose normal. No congestion.     Mouth/Throat:     Mouth: Mucous membranes are moist.     Pharynx: No oropharyngeal exudate.  Eyes:     Extraocular  Movements: Extraocular movements intact.     Conjunctiva/sclera: Conjunctivae normal.     Pupils: Pupils are equal, round, and reactive to light.  Cardiovascular:     Rate and Rhythm: Normal rate and regular rhythm.     Pulses: Normal pulses.     Heart sounds: Normal heart sounds. No murmur heard.   Pulmonary:     Effort: Pulmonary effort is normal.     Breath sounds: Normal breath sounds. No wheezing, rhonchi or rales.  Musculoskeletal:        General: Normal range of motion.     Right lower leg: No edema.     Left lower leg: No edema.  Skin:    General: Skin is warm and dry.     Findings: No rash.  Neurological:      General: No focal deficit present.     Mental Status: He is alert and oriented to person, place, and time.     Cranial Nerves: No cranial nerve deficit.  Psychiatric:        Mood and Affect: Mood normal.        Behavior: Behavior normal.        Thought Content: Thought content normal.        Judgment: Judgment normal.     Assessment and Plan   1. Essential hypertension  2. Type 2 diabetes mellitus without complication, without long-term current use of insulin (HCC) - glucose blood (ONETOUCH VERIO) test strip; Check blood glucose 2x per day.  Dispense: 100 strip; Refill: 2  3. Elevated LFTs   htn- uncontrolled.  Pt stating very nervous, however feel that his htn is not under good control at baseline. -cont with benazepril/hcz in am and take the benazepril 23m at night. Cont with diltiazem 4257min am.  - on today's visit, advised pt to go home at take his diltiazem and benazepril /hctz.   dm2- uncontrolled.  a1c at 10 on last visit in 2/25, last year a1c 7.3. pt was started on lantus 10 units at night.  bg have improved per pt.  cont glipizide, metformin, and lantus.  Elevated LFTs- may need u/s abdomen if enzymes don't improve.  Return in about 2 months (around 05/22/2021) for f/u htn, dm2.   03/27/2021

## 2021-04-13 ENCOUNTER — Other Ambulatory Visit: Payer: Self-pay | Admitting: Family Medicine

## 2021-04-19 ENCOUNTER — Other Ambulatory Visit: Payer: Self-pay

## 2021-04-19 DIAGNOSIS — E119 Type 2 diabetes mellitus without complications: Secondary | ICD-10-CM

## 2021-04-19 MED ORDER — GLIPIZIDE 5 MG PO TABS: ORAL_TABLET | ORAL | 2 refills | Status: DC

## 2021-05-16 ENCOUNTER — Telehealth: Payer: Self-pay

## 2021-05-16 DIAGNOSIS — I10 Essential (primary) hypertension: Secondary | ICD-10-CM

## 2021-05-16 DIAGNOSIS — E119 Type 2 diabetes mellitus without complications: Secondary | ICD-10-CM

## 2021-05-16 NOTE — Telephone Encounter (Signed)
Bmp and a1c. Thx. Dr. Darene Lamer

## 2021-05-16 NOTE — Telephone Encounter (Signed)
Blood work ordered in Epic. Patient notified. 

## 2021-05-16 NOTE — Telephone Encounter (Signed)
Caleb Ballard come by he has appt on 05/31 and wants to know when he can get his blood work done?   Pt call back [phone]

## 2021-05-16 NOTE — Telephone Encounter (Signed)
Last labs 02/18/21: Lipid, Liver, HgbA1c, PSA

## 2021-05-18 ENCOUNTER — Other Ambulatory Visit: Payer: Self-pay | Admitting: Family Medicine

## 2021-05-18 DIAGNOSIS — E119 Type 2 diabetes mellitus without complications: Secondary | ICD-10-CM

## 2021-05-18 NOTE — Telephone Encounter (Signed)
Lab Results  Component Value Date   HGBA1C 10.0 (H) 02/18/2021    Lab Results  Component Value Date   CREATININE 0.87 08/11/2019     Lab Results  Component Value Date   CHOL 189 02/18/2021   HDL 32 (L) 02/18/2021   LDLCALC 120 (H) 02/18/2021   TRIG 207 (H) 02/18/2021   CHOLHDL 5.9 (H) 02/18/2021     BP Readings from Last 3 Encounters:  03/22/21 (!) 187/106  02/21/21 (!) 160/90  09/21/20 130/90

## 2021-05-19 DIAGNOSIS — E119 Type 2 diabetes mellitus without complications: Secondary | ICD-10-CM | POA: Diagnosis not present

## 2021-05-19 DIAGNOSIS — I10 Essential (primary) hypertension: Secondary | ICD-10-CM | POA: Diagnosis not present

## 2021-05-20 LAB — BASIC METABOLIC PANEL
BUN/Creatinine Ratio: 11 (ref 9–20)
BUN: 11 mg/dL (ref 6–24)
CO2: 27 mmol/L (ref 20–29)
Calcium: 9.4 mg/dL (ref 8.7–10.2)
Chloride: 98 mmol/L (ref 96–106)
Creatinine, Ser: 1.01 mg/dL (ref 0.76–1.27)
Glucose: 110 mg/dL — ABNORMAL HIGH (ref 65–99)
Potassium: 4.7 mmol/L (ref 3.5–5.2)
Sodium: 137 mmol/L (ref 134–144)
eGFR: 89 mL/min/{1.73_m2} (ref >59)

## 2021-05-20 LAB — HEMOGLOBIN A1C
Est. average glucose Bld gHb Est-mCnc: 163 mg/dL
Hgb A1c MFr Bld: 7.3 % — ABNORMAL HIGH (ref 4.8–5.6)

## 2021-05-24 ENCOUNTER — Ambulatory Visit: Payer: BC Managed Care – PPO | Admitting: Family Medicine

## 2021-05-24 ENCOUNTER — Other Ambulatory Visit: Payer: Self-pay

## 2021-05-24 ENCOUNTER — Encounter: Payer: Self-pay | Admitting: Family Medicine

## 2021-05-24 VITALS — BP 142/88 | HR 99 | Temp 98.1°F | Ht 72.0 in | Wt 293.0 lb

## 2021-05-24 DIAGNOSIS — E785 Hyperlipidemia, unspecified: Secondary | ICD-10-CM | POA: Diagnosis not present

## 2021-05-24 DIAGNOSIS — E119 Type 2 diabetes mellitus without complications: Secondary | ICD-10-CM

## 2021-05-24 DIAGNOSIS — I10 Essential (primary) hypertension: Secondary | ICD-10-CM

## 2021-05-24 MED ORDER — METFORMIN HCL 500 MG PO TABS: ORAL_TABLET | ORAL | 1 refills | Status: DC

## 2021-05-24 MED ORDER — OLOPATADINE HCL 0.2 % OP SOLN: OPHTHALMIC | 0 refills | Status: DC

## 2021-05-24 MED ORDER — DILTIAZEM HCL ER BEADS 420 MG PO CP24: 420.0000 mg | ORAL_CAPSULE | Freq: Every day | ORAL | 1 refills | Status: DC

## 2021-05-24 MED ORDER — BENAZEPRIL HCL 10 MG PO TABS: 10.0000 mg | ORAL_TABLET | Freq: Every day | ORAL | 1 refills | Status: DC

## 2021-05-24 MED ORDER — BENAZEPRIL-HYDROCHLOROTHIAZIDE 20-25 MG PO TABS: 1.0000 | ORAL_TABLET | Freq: Every day | ORAL | 1 refills | Status: DC

## 2021-05-24 NOTE — Progress Notes (Signed)
Patient ID: Caleb Ballard, male    DOB: 1966/09/05, 55 y.o.   MRN: [id number]   Chief Complaint  Patient presents with   Hypertension   Diabetes   Subjective:    HPI Pt here for follow up on blood pressure and DM. Sugars are running really good. Checking sugars twice a day. No issues. Taking meds as directed. Pt has given up potatoes, snacks, sugary drinks, and fast food.   BP reading for past few days 116/70 122/80 Glucose averages. March 128.5 April 111.5  Working on changes in diet.  Exercising- increased some.  a1c- improved to 7.3, and was at 10.  Didn't take the lantus.  Pt gave up fast food, snacks, soda.  Walking daily.  Started at 5 days per week and now at 7 days per week.  His long term goal is to come off all the diabetic and htn meds.  Cut grass yesterday on rt eye.  Itchy watery.  Feels like something in it. Not any contact with pink eye.  Has had 140-150s when going out of town. Taking first morning diabetic meds at lunch. Since not eating breakfast.  Walking more in plant. Noticing around 3pm dropping.  Wt Readings from Last 3 Encounters:  05/24/21 293 lb (132.9 kg)  03/22/21 298 lb (135.2 kg)  02/21/21 (!) 302 lb 3.2 oz (137.1 kg)    Medical History [place] has a past medical history of Diabetes mellitus without complication ([place]), Hyperlipidemia, Hypertension, and Ruptured lumbar disc.   Outpatient Encounter Medications as of 05/24/2021  Medication Sig   Alcohol Swabs (ALCOHOL PADS) 70 % PADS Use as directed.   blood glucose meter kit and supplies KIT Dispense based on patient and insurance preference. Test blood sugar once daily. Diabetes type E11.9   glipiZIDE (GLUCOTROL) 5 MG tablet TAKE 2 TABLETS(10 MG) BY MOUTH TWICE DAILY   glucose blood (ONETOUCH VERIO) test strip Check blood glucose 2x per day.   Lancets (ONETOUCH DELICA PLUS [id number]) MISC USE TO TEST ONCE DAILY   Olopatadine HCl 0.2 % SOLN Instill 1 drop per eye daily for 1  week.   OVER THE COUNTER MEDICATION Asprin 81 mg daily.   [DISCONTINUED] benazepril (LOTENSIN) 10 MG tablet Take 1 tablet (10 mg total) by mouth daily.   [DISCONTINUED] benazepril-hydrochlorthiazide (LOTENSIN HCT) 20-25 MG tablet Take 1 tablet by mouth daily.   [DISCONTINUED] diltiazem (TIAZAC) 420 MG 24 hr capsule Take 1 capsule (420 mg total) by mouth daily.   [DISCONTINUED] Insulin Pen Needle (NOVOFINE PEN NEEDLE) 32G X 6 MM MISC Use as directed with lantus pen daily.   [DISCONTINUED] metFORMIN (GLUCOPHAGE) 500 MG tablet TAKE 2 TABLETS BY MOUTH TWICE DAILY WITH FOOD   atorvastatin (LIPITOR) 20 MG tablet Take 1 tablet (20 mg total) by mouth at bedtime. (Patient not taking: Reported on 05/24/2021)   benazepril (LOTENSIN) 10 MG tablet Take 1 tablet (10 mg total) by mouth daily.   benazepril-hydrochlorthiazide (LOTENSIN HCT) 20-25 MG tablet Take 1 tablet by mouth daily.   diltiazem (TIAZAC) 420 MG 24 hr capsule Take 1 capsule (420 mg total) by mouth daily.   metFORMIN (GLUCOPHAGE) 500 MG tablet TAKE 2 TABLETS BY MOUTH TWICE DAILY WITH FOOD   [DISCONTINUED] insulin glargine (LANTUS SOLOSTAR) 100 UNIT/ML Solostar Pen Inject 10 Units into the skin daily. (Patient not taking: Reported on 05/24/2021)   No facility-administered encounter medications on file as of 05/24/2021.     Review of Systems  Constitutional: Negative for chills and  fever.  HENT: Negative for congestion, rhinorrhea and sore throat.   Respiratory: Negative for cough, shortness of breath and wheezing.   Cardiovascular: Negative for chest pain and leg swelling.  Gastrointestinal: Negative for abdominal pain, diarrhea, nausea and vomiting.  Genitourinary: Negative for dysuria and frequency.  Skin: Negative for rash.  Neurological: Negative for dizziness, weakness and headaches.     Vitals BP (!) 142/88   Pulse 99   Temp 98.1 F (36.7 C)   Ht 6' (1.829 m)   Wt 293 lb (132.9 kg)   SpO2 98%   BMI 39.74 kg/m   Objective:    Physical Exam Vitals and nursing note reviewed.  Constitutional:      General: He is not in acute distress.    Appearance: Normal appearance. He is not ill-appearing.  HENT:     Head: Normocephalic.     Nose: Nose normal. No congestion.     Mouth/Throat:     Mouth: Mucous membranes are moist.     Pharynx: No oropharyngeal exudate.  Eyes:     Extraocular Movements: Extraocular movements intact.     Conjunctiva/sclera: Conjunctivae normal.     Pupils: Pupils are equal, round, and reactive to light.  Cardiovascular:     Rate and Rhythm: Normal rate and regular rhythm.     Pulses: Normal pulses.     Heart sounds: Normal heart sounds. No murmur heard.   Pulmonary:     Effort: Pulmonary effort is normal.     Breath sounds: Normal breath sounds. No wheezing, rhonchi or rales.  Musculoskeletal:        General: Normal range of motion.     Right lower leg: No edema.     Left lower leg: No edema.  Skin:    General: Skin is warm and dry.     Findings: No rash.  Neurological:     General: No focal deficit present.     Mental Status: He is alert and oriented to person, place, and time.     Cranial Nerves: No cranial nerve deficit.  Psychiatric:        Mood and Affect: Mood normal.        Behavior: Behavior normal.        Thought Content: Thought content normal.        Judgment: Judgment normal.      Assessment and Plan   1. Type 2 diabetes mellitus without complication, without long-term current use of insulin (HCC) - metFORMIN (GLUCOPHAGE) 500 MG tablet; TAKE 2 TABLETS BY MOUTH TWICE DAILY WITH FOOD  Dispense: 360 tablet; Refill: 1  2. Essential hypertension - diltiazem (TIAZAC) 420 MG 24 hr capsule; Take 1 capsule (420 mg total) by mouth daily.  Dispense: 90 capsule; Refill: 1 - benazepril (LOTENSIN) 10 MG tablet; Take 1 tablet (10 mg total) by mouth daily.  Dispense: 90 tablet; Refill: 1 - benazepril-hydrochlorthiazide (LOTENSIN HCT) 20-25 MG tablet; Take 1 tablet by  mouth daily.  Dispense: 90 tablet; Refill: 1  3. Hyperlipidemia LDL goal <100    htn- suboptimal.  Advising to check at home/work and call if seeing numbers over 140/85. Pt stating has higher bp when in office, pt thinking he has white coat syndrome. Cont meds.  dm2- stable, improved at goal.  Worked hard on diet and exercising.  Pt didn't take lantus and changed diet and a1c went from 10 to 7.3.  Pt cont on metformin and glipizide.  Hld- stable. Cont meds.  Return in about 4  months (around 09/23/2021) for f/u dm2, htn, hld.

## 2021-06-23 ENCOUNTER — Telehealth: Payer: Self-pay | Admitting: Family Medicine

## 2021-06-23 MED ORDER — ONETOUCH DELICA PLUS LANCET33G MISC: 1 refills | Status: DC

## 2021-06-23 NOTE — Telephone Encounter (Signed)
Lancets sent to pharmacy with directions to test BID. Pt contacted and is aware

## 2021-06-23 NOTE — Telephone Encounter (Signed)
Pt came in the office states that he needs a refill on  Lancets (ONETOUCH DELICA PLUS [id number]) [place] states that the pharmacy needs it to say twice a day since patient test twice a day  If any questions patient voiced, can call pt back   [facility] [phone] - [place], [place] AT [address]

## 2021-08-09 ENCOUNTER — Encounter: Payer: Self-pay | Admitting: Family Medicine

## 2021-08-09 ENCOUNTER — Other Ambulatory Visit: Payer: Self-pay

## 2021-08-09 ENCOUNTER — Ambulatory Visit (HOSPITAL_COMMUNITY)
Admission: RE | Admit: 2021-08-09 | Discharge: 2021-08-09 | Disposition: A | Discharge status: Home / Self Care | Payer: BC Managed Care – PPO | Source: Ambulatory Visit | Attending: Family Medicine | Admitting: Family Medicine

## 2021-08-09 ENCOUNTER — Ambulatory Visit: Payer: BC Managed Care – PPO | Admitting: Family Medicine

## 2021-08-09 VITALS — BP 138/82 | HR 100 | Temp 97.2°F | Wt 283.8 lb

## 2021-08-09 DIAGNOSIS — E119 Type 2 diabetes mellitus without complications: Secondary | ICD-10-CM

## 2021-08-09 DIAGNOSIS — M542 Cervicalgia: Secondary | ICD-10-CM

## 2021-08-09 MED ORDER — METHOCARBAMOL 500 MG PO TABS: 500.0000 mg | ORAL_TABLET | Freq: Three times a day (TID) | ORAL | 0 refills | Status: DC | PRN

## 2021-08-09 NOTE — Progress Notes (Addendum)
    Patient ID: Caleb Ballard, male    DOB: 03/03/1966, 55 y.o.   MRN: [id number]   Chief Complaint  Patient presents with   Headache   Subjective:    HPI  Pt states about 2 weeks ago he fell asleep in the bed watching TV; woke up with crick in neck. Pt states crick in neck went away but still having pressure in back of head. Pt states he does not have headaches. If he takes med for headache and it goes away but returns. Pressu

## 2021-08-19 DIAGNOSIS — E119 Type 2 diabetes mellitus without complications: Secondary | ICD-10-CM | POA: Diagnosis not present

## 2021-08-20 LAB — CMP14+EGFR
ALT: 17 IU/L (ref 0–44)
AST: 19 IU/L (ref 0–40)
Albumin/Globulin Ratio: 1.1 — ABNORMAL LOW (ref 1.2–2.2)
Albumin: 4.3 g/dL (ref 3.8–4.9)
Alkaline Phosphatase: 94 IU/L (ref 44–121)
BUN/Creatinine Ratio: 13 (ref 9–20)
BUN: 11 mg/dL (ref 6–24)
Bilirubin Total: 0.2 mg/dL (ref 0.0–1.2)
CO2: 25 mmol/L (ref 20–29)
Calcium: 9.8 mg/dL (ref 8.7–10.2)
Chloride: 101 mmol/L (ref 96–106)
Creatinine, Ser: 0.88 mg/dL (ref 0.76–1.27)
Globulin, Total: 3.9 g/dL (ref 1.5–4.5)
Glucose: 119 mg/dL — ABNORMAL HIGH (ref 65–99)
Potassium: 4.4 mmol/L (ref 3.5–5.2)
Sodium: 140 mmol/L (ref 134–144)
Total Protein: 8.2 g/dL (ref 6.0–8.5)
eGFR: 103 mL/min/{1.73_m2} (ref >59)

## 2021-08-20 LAB — HEMOGLOBIN A1C
Est. average glucose Bld gHb Est-mCnc: 157 mg/dL
Hgb A1c MFr Bld: 7.1 % — ABNORMAL HIGH (ref 4.8–5.6)

## 2021-08-24 ENCOUNTER — Ambulatory Visit: Payer: BC Managed Care – PPO | Admitting: Family Medicine

## 2021-08-24 ENCOUNTER — Other Ambulatory Visit: Payer: Self-pay

## 2021-08-24 VITALS — BP 136/86 | HR 88 | Ht 72.0 in | Wt 283.0 lb

## 2021-08-24 DIAGNOSIS — H1132 Conjunctival hemorrhage, left eye: Secondary | ICD-10-CM

## 2021-08-24 DIAGNOSIS — E119 Type 2 diabetes mellitus without complications: Secondary | ICD-10-CM

## 2021-08-24 DIAGNOSIS — E785 Hyperlipidemia, unspecified: Secondary | ICD-10-CM | POA: Diagnosis not present

## 2021-08-24 DIAGNOSIS — I10 Essential (primary) hypertension: Secondary | ICD-10-CM

## 2021-08-24 MED ORDER — ATORVASTATIN CALCIUM 20 MG PO TABS: 20.0000 mg | ORAL_TABLET | Freq: Every day | ORAL | 1 refills | Status: DC

## 2021-08-24 MED ORDER — GLIPIZIDE 5 MG PO TABS: ORAL_TABLET | ORAL | 2 refills | Status: DC

## 2021-08-24 MED ORDER — BENAZEPRIL-HYDROCHLOROTHIAZIDE 20-25 MG PO TABS: 1.0000 | ORAL_TABLET | Freq: Every day | ORAL | 1 refills | Status: DC

## 2021-08-24 MED ORDER — DILTIAZEM HCL ER BEADS 420 MG PO CP24: 420.0000 mg | ORAL_CAPSULE | Freq: Every day | ORAL | 1 refills | Status: DC

## 2021-08-24 MED ORDER — OLOPATADINE HCL 0.2 % OP SOLN: OPHTHALMIC | 0 refills | Status: AC

## 2021-08-24 MED ORDER — METFORMIN HCL 500 MG PO TABS: ORAL_TABLET | ORAL | 1 refills | Status: DC

## 2021-08-24 NOTE — Progress Notes (Signed)
Patient ID: Caleb Ballard, male    DOB: 01-Apr-1966, 54 y.o.   MRN: [id number]   Chief Complaint  Patient presents with   Diabetes    Follow up- discuss recent labs   Subjective:  Patient also has redness in left eyes from allergies  HPI  Dm2- Compliant with medications. Checking blood glucose.   Not seeing any high or low numbers.  Denies polyuria or polydipsia.  Eye exam: overdue Foot exam: no foot concerns.  Lab Results  Component Value Date   HGBA1C 7.1 (H) 08/19/2021   In spring pt was given lantus to try due to a1c being elevated at 10.0, in 2/22.  Pt didn't take it.  And worked on diet and exercising and brought a1c down to 7.1. Pt is still on glipizide and metformin.  Neck pain improved since last visit. Today having concern of Left eye is red, h/o allergies. Working in area with lots of dust. Has used eye drops.  Was using pataday drops.   Feels scratchy and dry. Redness medial left eye. Wearing glasses.  Working with dust and agitates it. Vision is good still on left eye.     Medical History Caleb Ballard has a past medical history of Diabetes mellitus without complication (HCC), Hyperlipidemia, Hypertension, and Ruptured lumbar disc.   Outpatient Encounter Medications as of 08/24/2021  Medication Sig   Alcohol Swabs (ALCOHOL PADS) 70 % PADS Use as directed.   atorvastatin (LIPITOR) 20 MG tablet Take 1 tablet (20 mg total) by mouth at bedtime.   benazepril-hydrochlorthiazide (LOTENSIN HCT) 20-25 MG tablet Take 1 tablet by mouth daily.   blood glucose meter kit and supplies KIT Dispense based on patient and insurance preference. Test blood sugar once daily. Diabetes type E11.9   diltiazem (TIAZAC) 420 MG 24 hr capsule Take 1 capsule (420 mg total) by mouth daily.   glipiZIDE (GLUCOTROL) 5 MG tablet TAKE 2 TABLETS(10 MG) BY MOUTH TWICE DAILY   glucose blood (ONETOUCH VERIO) test strip Check blood glucose 2x per day.   Lancets (ONETOUCH DELICA PLUS LANCET33G)  MISC Use to check blood sugars BID   metFORMIN (GLUCOPHAGE) 500 MG tablet TAKE 2 TABLETS BY MOUTH TWICE DAILY WITH FOOD   methocarbamol (ROBAXIN) 500 MG tablet Take 1 tablet (500 mg total) by mouth every 8 (eight) hours as needed for muscle spasms.   Olopatadine HCl 0.2 % SOLN Instill 1 drop per eye daily for 1 week.   OVER THE COUNTER MEDICATION Asprin 81 mg daily.   [DISCONTINUED] atorvastatin (LIPITOR) 20 MG tablet Take 1 tablet (20 mg total) by mouth at bedtime.   [DISCONTINUED] benazepril (LOTENSIN) 10 MG tablet Take 1 tablet (10 mg total) by mouth daily.   [DISCONTINUED] benazepril-hydrochlorthiazide (LOTENSIN HCT) 20-25 MG tablet Take 1 tablet by mouth daily.   [DISCONTINUED] diltiazem (TIAZAC) 420 MG 24 hr capsule Take 1 capsule (420 mg total) by mouth daily.   [DISCONTINUED] glipiZIDE (GLUCOTROL) 5 MG tablet TAKE 2 TABLETS(10 MG) BY MOUTH TWICE DAILY   [DISCONTINUED] metFORMIN (GLUCOPHAGE) 500 MG tablet TAKE 2 TABLETS BY MOUTH TWICE DAILY WITH FOOD   [DISCONTINUED] Olopatadine HCl 0.2 % SOLN Instill 1 drop per eye daily for 1 week.   No facility-administered encounter medications on file as of 08/24/2021.     Review of Systems  Constitutional:  Negative for chills and fever.  HENT:  Negative for congestion, rhinorrhea and sore throat.   Eyes:  Positive for redness and itching. Negative for photophobia, pain, discharge and visual  disturbance.  Respiratory:  Negative for cough, shortness of breath and wheezing.   Cardiovascular:  Negative for chest pain and leg swelling.  Gastrointestinal:  Negative for abdominal pain, diarrhea, nausea and vomiting.  Genitourinary:  Negative for dysuria and frequency.  Skin:  Negative for rash.  Neurological:  Negative for dizziness, weakness and headaches.    Vitals BP 136/86   Pulse 88   Ht 6' (1.829 m)   Wt 283 lb (128.4 kg)   SpO2 99%   BMI 38.38 kg/m   Objective:   Physical Exam Vitals and nursing note reviewed.  Constitutional:       General: He is not in acute distress.    Appearance: Normal appearance. He is not ill-appearing.  Eyes:     Extraocular Movements: Extraocular movements intact.     Pupils: Pupils are equal, round, and reactive to light.     Comments: +subconj hemorrhage on left medial eye. No drainage.  Cardiovascular:     Rate and Rhythm: Normal rate.  Pulmonary:     Effort: Pulmonary effort is normal.  Musculoskeletal:        General: Normal range of motion.  Skin:    General: Skin is warm and dry.     Findings: No rash.  Neurological:     General: No focal deficit present.     Mental Status: He is alert and oriented to person, place, and time.  Psychiatric:        Mood and Affect: Mood normal.        Behavior: Behavior normal.        Thought Content: Thought content normal.        Judgment: Judgment normal.     Assessment and Plan   1. Essential hypertension - diltiazem (TIAZAC) 420 MG 24 hr capsule; Take 1 capsule (420 mg total) by mouth daily.  Dispense: 90 capsule; Refill: 1 - benazepril-hydrochlorthiazide (LOTENSIN HCT) 20-25 MG tablet; Take 1 tablet by mouth daily.  Dispense: 90 tablet; Refill: 1  2. Hyperlipidemia LDL goal <100 - atorvastatin (LIPITOR) 20 MG tablet; Take 1 tablet (20 mg total) by mouth at bedtime.  Dispense: 90 tablet; Refill: 1  3. Type 2 diabetes mellitus without complication, without long-term current use of insulin (HCC) - glipiZIDE (GLUCOTROL) 5 MG tablet; TAKE 2 TABLETS(10 MG) BY MOUTH TWICE DAILY  Dispense: 120 tablet; Refill: 2 - metFORMIN (GLUCOPHAGE) 500 MG tablet; TAKE 2 TABLETS BY MOUTH TWICE DAILY WITH FOOD  Dispense: 360 tablet; Refill: 1  4. Subconjunctival hemorrhage of left eye - Olopatadine HCl 0.2 % SOLN; Instill 1 drop per eye daily for 1 week.  Dispense: 2.5 mL; Refill: 0    Dm2- stable, improved a1c at 7.1.  cont meds and diet.  Needing eye exam, pt will schedule.  Declining referral.  Htn- suboptimal.  Cont to eat low salt diet.   Cont meds.  Hld- improved, cont meds. Recheck lipids next visit.  Subconj hemorrhage- left eye.  Avoid rubbing eyes and use pataday eye drop prn. Call or rto if not able to see ophthalmology.  Pt also to schedule the diabetic eye exam.  Return in about 6 months (around 02/21/2022) for f/u dm2, htn, hld.

## 2021-09-04 NOTE — Progress Notes (Signed)
Patient ID: Caleb Ballard, male    DOB: [birth year], 55 y.o.   MRN: [id number]   Chief Complaint  Patient presents with   Headache   Subjective:    HPI  Pt states about 2 weeks ago he fell asleep in the bed watching TV; woke up with crick in neck. Pt states crick in neck went away but still having pressure in back of head. Pt states he does not have headaches. If he takes med for headache and it goes away but returns. Pressure is at base of neck and radiates upwards.   Pt has appt for 08/24/21 for DM follow up. Does pt need labs?  Went to bed and neck "kinked" and pain in neck.  That resolved.  But tension in neck and headache in back of head.  On right side of neck and middle.  Meds- aleve and tylenol and helps with pain.  Laying down at night, then hurting when laying on the back of head.  In morning it's morning. No numb in arms, no vision changes. Had neck pains before. No h/o surgery on neck.   Medical History Caleb Ballard has a past medical history of Diabetes mellitus without complication ([place]), Hyperlipidemia, Hypertension, and Ruptured lumbar disc.   Outpatient Encounter Medications as of 08/09/2021  Medication Sig   Alcohol Swabs (ALCOHOL PADS) 70 % PADS Use as directed.   blood glucose meter kit and supplies KIT Dispense based on patient and insurance preference. Test blood sugar once daily. Diabetes type E11.9   glucose blood (ONETOUCH VERIO) test strip Check blood glucose 2x per day.   Lancets (ONETOUCH DELICA PLUS [id number]) MISC Use to check blood sugars BID   methocarbamol (ROBAXIN) 500 MG tablet Take 1 tablet (500 mg total) by mouth every 8 (eight) hours as needed for muscle spasms.   OVER THE COUNTER MEDICATION Asprin 81 mg daily.   [DISCONTINUED] atorvastatin (LIPITOR) 20 MG tablet Take 1 tablet (20 mg total) by mouth at bedtime.   [DISCONTINUED] benazepril (LOTENSIN) 10 MG tablet Take 1 tablet (10 mg total) by mouth daily.   [DISCONTINUED]  benazepril-hydrochlorthiazide (LOTENSIN HCT) 20-25 MG tablet Take 1 tablet by mouth daily.   [DISCONTINUED] diltiazem (TIAZAC) 420 MG 24 hr capsule Take 1 capsule (420 mg total) by mouth daily.   [DISCONTINUED] glipiZIDE (GLUCOTROL) 5 MG tablet TAKE 2 TABLETS(10 MG) BY MOUTH TWICE DAILY   [DISCONTINUED] metFORMIN (GLUCOPHAGE) 500 MG tablet TAKE 2 TABLETS BY MOUTH TWICE DAILY WITH FOOD   [DISCONTINUED] Olopatadine HCl 0.2 % SOLN Instill 1 drop per eye daily for 1 week.   No facility-administered encounter medications on file as of 08/09/2021.     Review of Systems  Constitutional:  Negative for chills and fever.  HENT:  Negative for congestion, rhinorrhea and sore throat.   Respiratory:  Negative for cough, shortness of breath and wheezing.   Cardiovascular:  Negative for chest pain and leg swelling.  Gastrointestinal:  Negative for abdominal pain, diarrhea, nausea and vomiting.  Genitourinary:  Negative for dysuria and frequency.  Musculoskeletal:  Positive for neck pain.  Skin:  Negative for rash.  Neurological:  Negative for dizziness, weakness and headaches.    Vitals BP 138/82   Pulse 100   Temp (!) 97.2 F (36.2 C)   Wt 283 lb 12.8 oz (128.7 kg)   SpO2 98%   BMI 38.49 kg/m   Objective:   Physical Exam Vitals and nursing note reviewed.  Constitutional:      General:  He is not in acute distress.    Appearance: Normal appearance. He is not ill-appearing.  HENT:     Head: Normocephalic.     Nose: Nose normal. No congestion.     Mouth/Throat:     Mouth: Mucous membranes are moist.     Pharynx: No oropharyngeal exudate.  Eyes:     Extraocular Movements: Extraocular movements intact.     Conjunctiva/sclera: Conjunctivae normal.     Pupils: Pupils are equal, round, and reactive to light.  Neck:     Comments: +ttp over cervical paraspinal area.  No ttp over spinous process. Normal rom with neck Cardiovascular:     Rate and Rhythm: Normal rate and regular rhythm.      Pulses: Normal pulses.     Heart sounds: Normal heart sounds. No murmur heard. Pulmonary:     Effort: Pulmonary effort is normal.     Breath sounds: Normal breath sounds. No wheezing, rhonchi or rales.  Musculoskeletal:        General: Normal range of motion.     Cervical back: Normal range of motion.     Right lower leg: No edema.     Left lower leg: No edema.  Skin:    General: Skin is warm and dry.     Findings: No rash.  Neurological:     General: No focal deficit present.     Mental Status: He is alert and oriented to person, place, and time.     Cranial Nerves: No cranial nerve deficit.  Psychiatric:        Mood and Affect: Mood normal.        Behavior: Behavior normal.        Thought Content: Thought content normal.        Judgment: Judgment normal.     Assessment and Plan   1. Neck pain, acute - DG Cervical Spine Complete; Future - methocarbamol (ROBAXIN) 500 MG tablet; Take 1 tablet (500 mg total) by mouth every 8 (eight) hours as needed for muscle spasms.  Dispense: 30 tablet; Refill: 0  2. Type 2 diabetes mellitus without complication, without long-term current use of insulin (HCC) - CMP14+EGFR - Hemoglobin A1c   Pt to get x-ray of cervical spine.  Continue to use anti-inflammatories and Robaxin.  Heat and ice as needed.  Will call with results.  Diabetes type 2- we will order labs.  Return if symptoms worsen or fail to improve.

## 2021-11-09 ENCOUNTER — Encounter: Payer: Self-pay | Admitting: *Deleted

## 2021-11-24 ENCOUNTER — Ambulatory Visit: Payer: BC Managed Care – PPO | Admitting: Family Medicine

## 2021-12-25 ENCOUNTER — Other Ambulatory Visit: Payer: Self-pay | Admitting: Family Medicine

## 2021-12-25 DIAGNOSIS — E119 Type 2 diabetes mellitus without complications: Secondary | ICD-10-CM

## 2022-01-03 ENCOUNTER — Ambulatory Visit: Payer: BC Managed Care – PPO | Admitting: Family Medicine

## 2022-01-03 ENCOUNTER — Other Ambulatory Visit: Payer: Self-pay

## 2022-01-03 VITALS — BP 166/98 | HR 107 | Temp 97.8°F | Ht 72.0 in | Wt 299.0 lb

## 2022-01-03 DIAGNOSIS — E119 Type 2 diabetes mellitus without complications: Secondary | ICD-10-CM | POA: Diagnosis not present

## 2022-01-03 DIAGNOSIS — E785 Hyperlipidemia, unspecified: Secondary | ICD-10-CM

## 2022-01-03 DIAGNOSIS — Z125 Encounter for screening for malignant neoplasm of prostate: Secondary | ICD-10-CM

## 2022-01-03 DIAGNOSIS — I10 Essential (primary) hypertension: Secondary | ICD-10-CM | POA: Diagnosis not present

## 2022-01-03 DIAGNOSIS — Z130 Encounter for screening for diseases of the blood and blood-forming organs and certain disorders involving the immune mechanism: Secondary | ICD-10-CM

## 2022-01-03 DIAGNOSIS — K219 Gastro-esophageal reflux disease without esophagitis: Secondary | ICD-10-CM

## 2022-01-03 MED ORDER — METFORMIN HCL 500 MG PO TABS: ORAL_TABLET | ORAL | 1 refills | Status: DC

## 2022-01-03 MED ORDER — GLIPIZIDE 5 MG PO TABS: 10.0000 mg | ORAL_TABLET | Freq: Two times a day (BID) | ORAL | 1 refills | Status: DC

## 2022-01-03 MED ORDER — AMLODIPINE BESYLATE 10 MG PO TABS: 10.0000 mg | ORAL_TABLET | Freq: Every day | ORAL | 1 refills | Status: DC

## 2022-01-03 NOTE — Patient Instructions (Signed)
Medications as prescribed.  Labs (fasting) when you can.  Follow up in 3 months.

## 2022-01-04 NOTE — Assessment & Plan Note (Signed)
Unsure of control.  Needs A1c.  Patient states that he is going to make dietary/lifestyle changes.  Continue metformin and glipizide at this time.  We will make adjustments to his medication regimen pending A1c.

## 2022-01-04 NOTE — Progress Notes (Signed)
Subjective:  Patient ID: Caleb Ballard, male    DOB: [birth year]  Age: 56 y.o. MRN: [id number]  CC: Chief Complaint  Patient presents with   Diabetes   Hypertension    HPI:  56 year old male with hypertension, hyperlipidemia, type 2 diabetes, morbid obesity presents for follow-up.  Hypertension Uncontrolled.  Patient has not taken his medication today.  He states that he often forgets to take his blood pressure medication.  He is prescribed benazepril/HCTZ as well as diltiazem. I am unsure why he is on diltiazem. Patient states that he knows he needs to be more compliant and make better choices.  Type 2 diabetes Has been fairly well controlled as of late.  However patient states that he has fallen off the bandwagon.  He has not been monitoring his diet and eating as well as he should.  He suspects that his blood sugars are uncontrolled.  He is not checking regularly.  He does not remember the last time he checked his blood sugar. He is on glipizide 10 mg twice daily as well as metformin 1000 mg twice daily.  Hyperlipidemia Unsure of control. Patient has not been particular compliant.  He is currently on Lipitor 20 mg.  Patient Active Problem List   Diagnosis Date Noted   Morbid obesity ([place]) 01/03/2022   Diabetes ([place]) 04/13/2013   Hyperlipidemia 02/14/2007   Essential hypertension 02/14/2007   GERD 02/14/2007    Social Hx   Social History   Socioeconomic History   Marital status: Single    Spouse name: Not on file   Number of children: Not on file   Years of education: Not on file   Highest education level: Not on file  Occupational History   Not on file  Tobacco Use   Smoking status: Never   Smokeless tobacco: Never  Vaping Use   Vaping Use: Never used  Substance and Sexual Activity   Alcohol use: Not Currently   Drug use: Not Currently   Sexual activity: Not on file  Other Topics Concern   Not on file  Social History Narrative   Not on file    Social Determinants of Health   Financial Resource Strain: Not on file  Food Insecurity: Not on file  Transportation Needs: Not on file  Physical Activity: Not on file  Stress: Not on file  Social Connections: Not on file    Review of Systems  Constitutional: Negative.   Respiratory: Negative.    Cardiovascular: Negative.     Objective:  BP (!) 166/98    Pulse (!) 107    Temp 97.8 F (36.6 C)    Ht 6' (1.829 m)    Wt 299 lb (135.6 kg)    SpO2 97%    BMI 40.55 kg/m   BP/Weight 01/03/2022 08/24/2021 8/00/3491  Systolic BP 791 505 697  Diastolic BP 98 86 82  Wt. (Lbs) 299 283 283.8  BMI 40.55 38.38 38.49   Physical Exam Vitals and nursing note reviewed.  Constitutional:      General: He is not in acute distress.    Appearance: Normal appearance. He is obese. He is not ill-appearing.  Eyes:     General:        Right eye: No discharge.        Left eye: No discharge.     Conjunctiva/sclera: Conjunctivae normal.  Cardiovascular:     Rate and Rhythm: Normal rate and regular rhythm.  Pulmonary:     Effort: Pulmonary effort  is normal.     Breath sounds: Normal breath sounds. No wheezing, rhonchi or rales.  Abdominal:     General: There is no distension.     Palpations: Abdomen is soft.     Tenderness: There is no abdominal tenderness.  Musculoskeletal:     Cervical back: Neck supple.  Lymphadenopathy:     Cervical: No cervical adenopathy.  Neurological:     Mental Status: He is alert.  Psychiatric:        Mood and Affect: Mood normal.        Behavior: Behavior normal.    Lab Results  Component Value Date   WBC 8.3 08/07/2009   HGB 13.9 08/07/2009   HCT 41.8 08/07/2009   PLT 345 08/07/2009   GLUCOSE 119 (H) 08/19/2021   CHOL 189 02/18/2021   TRIG 207 (H) 02/18/2021   HDL 32 (L) 02/18/2021   LDLCALC 120 (H) 02/18/2021   ALT 17 08/19/2021   AST 19 08/19/2021   NA 140 08/19/2021   K 4.4 08/19/2021   CL 101 08/19/2021   CREATININE 0.88 08/19/2021   BUN 11  08/19/2021   CO2 25 08/19/2021   TSH 2.561 08/07/2009   PSA 1.2 02/10/2015   HGBA1C 7.1 (H) 08/19/2021     Assessment & Plan:   Problem List Items Addressed This Visit       Cardiovascular and Mediastinum   Essential hypertension - Primary    Uncontrolled secondary to noncompliance.  Advised compliance.  Laboratory studies ordered. Stopping diltiazem.  Starting amlodipine.  Continue benazepril/HCTZ.      Relevant Medications   amLODipine (NORVASC) 10 MG tablet   Other Relevant Orders   CMP14+EGFR     Endocrine   Diabetes (HCC)    Unsure of control.  Needs A1c.  Patient states that he is going to make dietary/lifestyle changes.  Continue metformin and glipizide at this time.  We will make adjustments to his medication regimen pending A1c.      Relevant Medications   glipiZIDE (GLUCOTROL) 5 MG tablet   metFORMIN (GLUCOPHAGE) 500 MG tablet   Other Relevant Orders   CMP14+EGFR   Hemoglobin A1c     Other   Hyperlipidemia    Lipid panel ordered.  Continue Lipitor 20 mg daily.  May need dose increase pending laboratory results.      Relevant Medications   amLODipine (NORVASC) 10 MG tablet   Other Relevant Orders   Lipid panel   Morbid obesity (HCC)   Relevant Medications   glipiZIDE (GLUCOTROL) 5 MG tablet   metFORMIN (GLUCOPHAGE) 500 MG tablet   Other Visit Diagnoses     Screening for deficiency anemia       Relevant Orders   CBC   Prostate cancer screening       Relevant Orders   PSA       Meds ordered this encounter  Medications   glipiZIDE (GLUCOTROL) 5 MG tablet    Sig: Take 2 tablets (10 mg total) by mouth 2 (two) times daily before a meal.    Dispense:  360 tablet    Refill:  1   metFORMIN (GLUCOPHAGE) 500 MG tablet    Sig: TAKE 2 TABLETS BY MOUTH TWICE DAILY WITH FOOD    Dispense:  360 tablet    Refill:  1   amLODipine (NORVASC) 10 MG tablet    Sig: Take 1 tablet (10 mg total) by mouth daily.    Dispense:  90 tablet    Refill:  1     Follow-up:  Return in about 3 months (around 04/03/2022) for Follow up Chronic medical issues.  [place]

## 2022-01-04 NOTE — Assessment & Plan Note (Signed)
Uncontrolled secondary to noncompliance.  Advised compliance.  Laboratory studies ordered. Stopping diltiazem.  Starting amlodipine.  Continue benazepril/HCTZ.

## 2022-01-04 NOTE — Assessment & Plan Note (Signed)
Lipid panel ordered.  Continue Lipitor 20 mg daily.  May need dose increase pending laboratory results.

## 2022-01-16 DIAGNOSIS — I10 Essential (primary) hypertension: Secondary | ICD-10-CM | POA: Diagnosis not present

## 2022-01-16 DIAGNOSIS — Z130 Encounter for screening for diseases of the blood and blood-forming organs and certain disorders involving the immune mechanism: Secondary | ICD-10-CM | POA: Diagnosis not present

## 2022-01-16 DIAGNOSIS — Z125 Encounter for screening for malignant neoplasm of prostate: Secondary | ICD-10-CM | POA: Diagnosis not present

## 2022-01-16 DIAGNOSIS — E119 Type 2 diabetes mellitus without complications: Secondary | ICD-10-CM | POA: Diagnosis not present

## 2022-01-16 DIAGNOSIS — E785 Hyperlipidemia, unspecified: Secondary | ICD-10-CM | POA: Diagnosis not present

## 2022-01-17 LAB — CBC
Hematocrit: 43.4 % (ref 37.5–51.0)
Hemoglobin: 13.6 g/dL (ref 13.0–17.7)
MCH: 23.7 pg — ABNORMAL LOW (ref 26.6–33.0)
MCHC: 31.3 g/dL — ABNORMAL LOW (ref 31.5–35.7)
MCV: 76 fL — ABNORMAL LOW (ref 79–97)
Platelets: 318 10*3/uL (ref 150–450)
RBC: 5.74 x10E6/uL (ref 4.14–5.80)
RDW: 15.4 % (ref 11.6–15.4)
WBC: 15.8 10*3/uL — ABNORMAL HIGH (ref 3.4–10.8)

## 2022-01-17 LAB — LIPID PANEL
Chol/HDL Ratio: 5.7 ratio — ABNORMAL HIGH (ref 0.0–5.0)
Cholesterol, Total: 212 mg/dL — ABNORMAL HIGH (ref 100–199)
HDL: 37 mg/dL — ABNORMAL LOW (ref >39)
LDL Chol Calc (NIH): 137 mg/dL — ABNORMAL HIGH (ref 0–99)
Triglycerides: 208 mg/dL — ABNORMAL HIGH (ref 0–149)
VLDL Cholesterol Cal: 38 mg/dL (ref 5–40)

## 2022-01-17 LAB — HEMOGLOBIN A1C
Est. average glucose Bld gHb Est-mCnc: 174 mg/dL
Hgb A1c MFr Bld: 7.7 % — ABNORMAL HIGH (ref 4.8–5.6)

## 2022-01-17 LAB — CMP14+EGFR
ALT: 18 IU/L (ref 0–44)
AST: 15 IU/L (ref 0–40)
Albumin/Globulin Ratio: 1.2 (ref 1.2–2.2)
Albumin: 4.5 g/dL (ref 3.8–4.9)
Alkaline Phosphatase: 100 IU/L (ref 44–121)
BUN/Creatinine Ratio: 12 (ref 9–20)
BUN: 10 mg/dL (ref 6–24)
Bilirubin Total: 0.2 mg/dL (ref 0.0–1.2)
CO2: 27 mmol/L (ref 20–29)
Calcium: 9.3 mg/dL (ref 8.7–10.2)
Chloride: 98 mmol/L (ref 96–106)
Creatinine, Ser: 0.85 mg/dL (ref 0.76–1.27)
Globulin, Total: 3.7 g/dL (ref 1.5–4.5)
Glucose: 166 mg/dL — ABNORMAL HIGH (ref 70–99)
Potassium: 4.2 mmol/L (ref 3.5–5.2)
Sodium: 137 mmol/L (ref 134–144)
Total Protein: 8.2 g/dL (ref 6.0–8.5)
eGFR: 103 mL/min/{1.73_m2} (ref >59)

## 2022-01-17 LAB — PSA: Prostate Specific Ag, Serum: 2.5 ng/mL (ref 0.0–4.0)

## 2022-02-06 ENCOUNTER — Encounter: Payer: Self-pay | Admitting: Nurse Practitioner

## 2022-02-06 ENCOUNTER — Other Ambulatory Visit: Payer: Self-pay

## 2022-02-06 ENCOUNTER — Ambulatory Visit: Payer: BC Managed Care – PPO | Admitting: Nurse Practitioner

## 2022-02-06 VITALS — BP 164/88 | HR 99 | Temp 97.3°F | Ht 72.0 in | Wt 297.0 lb

## 2022-02-06 DIAGNOSIS — L600 Ingrowing nail: Secondary | ICD-10-CM | POA: Diagnosis not present

## 2022-02-06 MED ORDER — SULFAMETHOXAZOLE-TRIMETHOPRIM 800-160 MG PO TABS: 1.0000 | ORAL_TABLET | Freq: Two times a day (BID) | ORAL | 0 refills | Status: AC

## 2022-02-06 MED ORDER — MUPIROCIN 2 % EX OINT: 1.0000 "application " | TOPICAL_OINTMENT | Freq: Two times a day (BID) | CUTANEOUS | 0 refills | Status: DC

## 2022-02-06 NOTE — Progress Notes (Signed)
° °  Subjective:    Patient ID: Caleb Ballard, male    DOB: 1966-07-22, 56 y.o.   MRN: [id number]  HPI Patient here with complaints of pain to his right great toe x4 days.  Patient describes pain along the lateral side of his toenail that was worse on Thursday got better on Friday but then got worse again..  Patient stated that pain started on Thursday.  Patient denied any injury or fall.  Patient denies any fevers.  Patient has history of diabetes.  Current A1c 7.7.  Review of Systems  All other systems reviewed and are negative.     Objective:   Physical Exam Constitutional:      Appearance: Normal appearance. He is normal weight.  Cardiovascular:     Rate and Rhythm: Normal rate and regular rhythm.     Pulses: Normal pulses.     Heart sounds: Normal heart sounds. No murmur heard. Pulmonary:     Effort: Pulmonary effort is normal. No respiratory distress.     Breath sounds: Normal breath sounds. No wheezing.  Skin:    General: Skin is warm.     Capillary Refill: Capillary refill takes less than 2 seconds.     Comments: Small reddened area noted to lateral side of right great toe.  Toenail to right great toe slightly darkened great to color.  Toe nails need to be cut.  Neurological:     General: No focal deficit present.     Mental Status: He is alert and oriented to person, place, and time.  Psychiatric:        Mood and Affect: Mood normal.        Behavior: Behavior normal.          Assessment & Plan:   1. Ingrown toenail of right foot - Paronychia secondary to ingrown toenail.  - Cut toenails straight across carefully to avoid injury to toes.  - sulfamethoxazole-trimethoprim (BACTRIM DS) 800-160 MG tablet; Take 1 tablet by mouth 2 (two) times daily for 7 days.  Dispense: 14 tablet; Refill: 0 - mupirocin ointment (BACTROBAN) 2 %; Apply 1 application topically 2 (two) times daily.  Dispense: 22 g; Refill: 0  - Maintain routine foot care. - May use epsom salt soaks for  right foot - RTC in 2-3 days for evaluation to ensure infection is getting better - Will consider referral to podiatry if area still inflamed for possible toenail removal.

## 2022-02-09 ENCOUNTER — Ambulatory Visit: Payer: BC Managed Care – PPO | Admitting: Nurse Practitioner

## 2022-02-09 ENCOUNTER — Encounter: Payer: Self-pay | Admitting: Nurse Practitioner

## 2022-02-09 ENCOUNTER — Other Ambulatory Visit: Payer: Self-pay

## 2022-02-09 VITALS — BP 149/71 | HR 90 | Temp 98.1°F | Ht 72.0 in | Wt 300.0 lb

## 2022-02-09 DIAGNOSIS — L03031 Cellulitis of right toe: Secondary | ICD-10-CM | POA: Diagnosis not present

## 2022-02-09 DIAGNOSIS — L03032 Cellulitis of left toe: Secondary | ICD-10-CM

## 2022-02-09 NOTE — Progress Notes (Signed)
° °  Subjective:    Patient ID: Caleb Ballard, male    DOB: 1966/03/25, 56 y.o.   MRN: [id number]  HPI  Pt is her for R ingrown toenail follow up.  Patient states that area is doing a lot better with treatment.  Patient states that pain is gone away.  Patient states that he noticed that the area that was red is now white which started this morning.  Patient has no concerns  Review of Systems  All other systems reviewed and are negative.     Objective:   Physical Exam Constitutional:      General: He is not in acute distress.    Appearance: Normal appearance. He is not ill-appearing, toxic-appearing or diaphoretic.  Pulmonary:     Effort: Pulmonary effort is normal.  Skin:    General: Skin is warm.     Findings: Abscess present.     Comments: Abscess noted to right medial side of the toenail.  No erythema noted.  Mild swelling noted.  Neurological:     General: No focal deficit present.     Mental Status: He is alert and oriented to person, place, and time.  Psychiatric:        Mood and Affect: Mood normal.        Behavior: Behavior normal.          Assessment & Plan:  1. Paronychia of great toe of right foot -Continue using Bactrim as prescribed -Continue using Bactroban as prescribed -Return to clinic if area does not resolve after antibiotic use.   PROCEDURE Cleansed right medial great toe with betadine x2. Punctured abscess with 21G needled and gently applied pressure to express purulent drainage. Cleansed area with alcohol pads. Band-Aid applied to area. Patient tolerated well.

## 2022-03-14 ENCOUNTER — Ambulatory Visit: Payer: BC Managed Care – PPO | Admitting: Internal Medicine

## 2022-04-03 ENCOUNTER — Ambulatory Visit: Payer: BC Managed Care – PPO | Admitting: Family Medicine

## 2022-04-12 ENCOUNTER — Ambulatory Visit (INDEPENDENT_AMBULATORY_CARE_PROVIDER_SITE_OTHER): Payer: Managed Care, Other (non HMO) | Admitting: Family Medicine

## 2022-04-12 ENCOUNTER — Encounter: Payer: Self-pay | Admitting: Family Medicine

## 2022-04-12 DIAGNOSIS — E119 Type 2 diabetes mellitus without complications: Secondary | ICD-10-CM

## 2022-04-12 DIAGNOSIS — E785 Hyperlipidemia, unspecified: Secondary | ICD-10-CM

## 2022-04-12 DIAGNOSIS — I10 Essential (primary) hypertension: Secondary | ICD-10-CM

## 2022-04-12 MED ORDER — ATORVASTATIN CALCIUM 20 MG PO TABS: 20.0000 mg | ORAL_TABLET | Freq: Every day | ORAL | 3 refills | Status: DC

## 2022-04-12 MED ORDER — TIRZEPATIDE 2.5 MG/0.5ML ~~LOC~~ SOAJ: 2.5000 mg | SUBCUTANEOUS | 0 refills | Status: DC

## 2022-04-12 NOTE — Patient Instructions (Signed)
Continue your medications.  Follow up in 3 months.  

## 2022-04-12 NOTE — Assessment & Plan Note (Signed)
Lipitor refilled.  We will recheck at follow-up. ?

## 2022-04-12 NOTE — Progress Notes (Signed)
? ?Subjective:  ?Patient ID: Caleb Ballard, male    DOB: Sep 07, 1966  Age: 56 y.o. MRN: [id number] ? ?CC: ?Chief Complaint  ?Patient presents with  ? Hypertension  ? Diabetes  ?  Pt states doing well. No issues. Refill on Atorvastatin. Eye exam scheduled for May. Doesn't see podiatry regularly   ? ? ?HPI: ? ?56 year old male with type 2 diabetes, hypertension, morbid obesity, hyperlipidemia presents for follow-up.  Patient states that he is feeling very well. ? ?Diabetes remains uncontrolled.  Last A1c 7.7.  He has not particular been compliant.  He is currently on glipizide and metformin.  He would like to discuss Mounjaro today.  He states that one of his friends is on this medication and it has significantly helped his blood sugars as well as weight loss. ? ?Patient needs refill on Lipitor.  Lipids uncontrolled. ? ?BP elevated today.  He is currently on amlodipine and benazepril/HCTZ. ? ?Patient Active Problem List  ? Diagnosis Date Noted  ? Morbid obesity ([place]) 01/03/2022  ? Diabetes ([place]) 04/13/2013  ? Hyperlipidemia 02/14/2007  ? Essential hypertension 02/14/2007  ? GERD 02/14/2007  ? ? ?Social Hx   ?Social History  ? ?Socioeconomic History  ? Marital status: Single  ?  Spouse name: Not on file  ? Number of children: Not on file  ? Years of education: Not on file  ? Highest education level: Not on file  ?Occupational History  ? Not on file  ?Tobacco Use  ? Smoking status: Never  ? Smokeless tobacco: Never  ?Vaping Use  ? Vaping Use: Never used  ?Substance and Sexual Activity  ? Alcohol use: Not Currently  ? Drug use: Not Currently  ? Sexual activity: Not on file  ?Other Topics Concern  ? Not on file  ?Social History Narrative  ? Not on file  ? ?Social Determinants of Health  ? ?Financial Resource Strain: Not on file  ?Food Insecurity: Not on file  ?Transportation Needs: Not on file  ?Physical Activity: Not on file  ?Stress: Not on file  ?Social Connections: Not on file  ? ? ?Review of Systems   ?Constitutional: Negative.   ?Respiratory: Negative.    ?Cardiovascular: Negative.   ? ?Objective:  ?BP (!) 161/89   Pulse 89   Temp 98.1 ?F (36.7 ?C)   Wt 300 lb (136.1 kg)   SpO2 99%   BMI 40.69 kg/m?  ? ? ?  04/12/2022  ? 10:20 AM 02/09/2022  ?  9:03 AM 02/06/2022  ? 11:09 AM  ?BP/Weight  ?Systolic BP 706 237 628  ?Diastolic BP 89 71 88  ?Wt. (Lbs) 300 300 297  ?BMI 40.69 kg/m2 40.69 kg/m2 40.28 kg/m2  ? ? ?Physical Exam ?Vitals and nursing note reviewed.  ?Constitutional:   ?   General: He is not in acute distress. ?   Appearance: Normal appearance. He is not ill-appearing.  ?HENT:  ?   Head: Normocephalic and atraumatic.  ?Eyes:  ?   General:     ?   Right eye: No discharge.     ?   Left eye: No discharge.  ?   Conjunctiva/sclera: Conjunctivae normal.  ?Cardiovascular:  ?   Rate and Rhythm: Normal rate and regular rhythm.  ?Pulmonary:  ?   Effort: Pulmonary effort is normal.  ?   Breath sounds: Normal breath sounds. No wheezing, rhonchi or rales.  ?Neurological:  ?   Mental Status: He is alert.  ?Psychiatric:     ?  Mood and Affect: Mood normal.     ?   Behavior: Behavior normal.  ? ? ?Lab Results  ?Component Value Date  ? WBC 15.8 (H) 01/16/2022  ? HGB 13.6 01/16/2022  ? HCT 43.4 01/16/2022  ? PLT 318 01/16/2022  ? GLUCOSE 166 (H) 01/16/2022  ? CHOL 212 (H) 01/16/2022  ? TRIG 208 (H) 01/16/2022  ? HDL 37 (L) 01/16/2022  ? LDLCALC 137 (H) 01/16/2022  ? ALT 18 01/16/2022  ? AST 15 01/16/2022  ? NA 137 01/16/2022  ? K 4.2 01/16/2022  ? CL 98 01/16/2022  ? CREATININE 0.85 01/16/2022  ? BUN 10 01/16/2022  ? CO2 27 01/16/2022  ? TSH 2.561 08/07/2009  ? PSA 1.2 02/10/2015  ? HGBA1C 7.7 (H) 01/16/2022  ? ? ? ?Assessment & Plan:  ? ?Problem List Items Addressed This Visit   ? ?  ? Cardiovascular and Mediastinum  ? Essential hypertension  ?  BP elevated today.  No medication changes at this time.  We will continue to monitor. ? ?  ?  ? Relevant Medications  ? atorvastatin (LIPITOR) 20 MG tablet  ?  ? Endocrine  ?  Diabetes ([place])  ?  Uncontrolled.  Adding Mounjaro.  Continue metformin and glipizide. ? ?  ?  ? Relevant Medications  ? atorvastatin (LIPITOR) 20 MG tablet  ? tirzepatide [facility]) 2.5 MG/0.5ML Pen  ?  ? Other  ? Hyperlipidemia  ?  Lipitor refilled.  We will recheck at follow-up. ? ?  ?  ? Relevant Medications  ? atorvastatin (LIPITOR) 20 MG tablet  ? ?Other Visit Diagnoses   ? ? Hyperlipidemia LDL goal <100      ? Relevant Medications  ? atorvastatin (LIPITOR) 20 MG tablet  ? ?  ? ? ?Meds ordered this encounter  ?Medications  ? atorvastatin (LIPITOR) 20 MG tablet  ?  Sig: Take 1 tablet (20 mg total) by mouth at bedtime.  ?  Dispense:  90 tablet  ?  Refill:  3  ? tirzepatide (MOUNJARO) 2.5 MG/0.5ML Pen  ?  Sig: Inject 2.5 mg into the skin once a week.  ?  Dispense:  2 mL  ?  Refill:  0  ? ? ?Follow-up:  Return in about 3 months (around 07/12/2022). ? ?Thersa Salt DO ?[place] ? ?

## 2022-04-12 NOTE — Assessment & Plan Note (Signed)
BP elevated today.  No medication changes at this time.  We will continue to monitor. ?

## 2022-04-12 NOTE — Assessment & Plan Note (Signed)
Uncontrolled.  Adding Mounjaro.  Continue metformin and glipizide. ?

## 2022-04-29 ENCOUNTER — Other Ambulatory Visit: Payer: Self-pay | Admitting: Family Medicine

## 2022-04-29 DIAGNOSIS — I10 Essential (primary) hypertension: Secondary | ICD-10-CM

## 2022-05-03 ENCOUNTER — Other Ambulatory Visit: Payer: Self-pay | Admitting: Family Medicine

## 2022-05-03 DIAGNOSIS — E119 Type 2 diabetes mellitus without complications: Secondary | ICD-10-CM

## 2022-05-05 ENCOUNTER — Other Ambulatory Visit: Payer: Self-pay | Admitting: Family Medicine

## 2022-05-08 ENCOUNTER — Other Ambulatory Visit: Payer: Self-pay | Admitting: Family Medicine

## 2022-05-08 MED ORDER — TIRZEPATIDE 5 MG/0.5ML ~~LOC~~ SOAJ: 5.0000 mg | SUBCUTANEOUS | 0 refills | Status: DC

## 2022-05-09 LAB — HM DIABETES EYE EXAM

## 2022-06-01 ENCOUNTER — Other Ambulatory Visit: Payer: Self-pay | Admitting: Family Medicine

## 2022-06-01 DIAGNOSIS — E119 Type 2 diabetes mellitus without complications: Secondary | ICD-10-CM

## 2022-06-02 NOTE — Telephone Encounter (Signed)
LMTRC

## 2022-07-12 ENCOUNTER — Ambulatory Visit: Payer: Self-pay | Admitting: Family Medicine

## 2022-07-13 ENCOUNTER — Ambulatory Visit (INDEPENDENT_AMBULATORY_CARE_PROVIDER_SITE_OTHER): Payer: Managed Care, Other (non HMO) | Admitting: Family Medicine

## 2022-07-13 ENCOUNTER — Encounter: Payer: Self-pay | Admitting: Family Medicine

## 2022-07-13 VITALS — BP 132/72 | HR 77 | Temp 98.1°F | Wt 292.4 lb

## 2022-07-13 DIAGNOSIS — D72829 Elevated white blood cell count, unspecified: Secondary | ICD-10-CM | POA: Diagnosis not present

## 2022-07-13 DIAGNOSIS — E119 Type 2 diabetes mellitus without complications: Secondary | ICD-10-CM

## 2022-07-13 DIAGNOSIS — I10 Essential (primary) hypertension: Secondary | ICD-10-CM

## 2022-07-13 DIAGNOSIS — E785 Hyperlipidemia, unspecified: Secondary | ICD-10-CM

## 2022-07-13 MED ORDER — BLOOD GLUCOSE MONITOR KIT: PACK | 0 refills | Status: DC

## 2022-07-13 MED ORDER — TIRZEPATIDE 7.5 MG/0.5ML ~~LOC~~ SOAJ: 7.5000 mg | SUBCUTANEOUS | 0 refills | Status: DC

## 2022-07-13 NOTE — Progress Notes (Signed)
Subjective:  Patient ID: Caleb Ballard, male    DOB: 05-09-1966  Age: 56 y.o. MRN: [id number]  CC: Chief Complaint  Patient presents with   Hyperlipidemia   Hypertension    No issues.    Diabetes    No issues; checks sugars each morning. Pt would like new meter     HPI:  56 year old male with hypertension, GERD, diabetes, hyperlipidemia, obesity presents for follow-up.  Patient states that his fasting blood sugars have been around 1 16-1 25.  He is tolerating metformin, glipizide, and Mounjaro.  He would like to increase the dose of Mounjaro.  He has occasional hypoglycemia related to his eating habits in the setting of sulfonylurea.  He is requesting a new glucometer.  Needs A1c.  Hypertension is well controlled on amlodipine, benazepril/HCTZ.  Lipids have been uncontrolled mainly due to noncompliance.  He is on atorvastatin.  Needs repeat lipid panel.  Patient Active Problem List   Diagnosis Date Noted   Morbid obesity ([place]) 01/03/2022   Diabetes ([place]) 04/13/2013   Hyperlipidemia 02/14/2007   Essential hypertension 02/14/2007   GERD 02/14/2007    Social Hx   Social History   Socioeconomic History   Marital status: Single    Spouse name: Not on file   Number of children: Not on file   Years of education: Not on file   Highest education level: Not on file  Occupational History   Not on file  Tobacco Use   Smoking status: Never   Smokeless tobacco: Never  Vaping Use   Vaping Use: Never used  Substance and Sexual Activity   Alcohol use: Not Currently   Drug use: Not Currently   Sexual activity: Not on file  Other Topics Concern   Not on file  Social History Narrative   Not on file   Social Determinants of Health   Financial Resource Strain: Not on file  Food Insecurity: Not on file  Transportation Needs: Not on file  Physical Activity: Not on file  Stress: Not on file  Social Connections: Not on file    Review of Systems  Constitutional:  Negative.   Respiratory: Negative.    Cardiovascular: Negative.    Objective:  BP 132/72   Pulse 77   Temp 98.1 F (36.7 C)   Wt 292 lb 6.4 oz (132.6 kg)   SpO2 99%   BMI 39.66 kg/m      07/13/2022    8:20 AM 04/12/2022   10:20 AM 02/09/2022    9:03 AM  BP/Weight  Systolic BP 573 220 254  Diastolic BP 72 89 71  Wt. (Lbs) 292.4 300 300  BMI 39.66 kg/m2 40.69 kg/m2 40.69 kg/m2    Physical Exam Vitals and nursing note reviewed.  Constitutional:      General: He is not in acute distress.    Appearance: Normal appearance. He is obese.  HENT:     Head: Normocephalic and atraumatic.  Cardiovascular:     Rate and Rhythm: Normal rate and regular rhythm.  Pulmonary:     Effort: Pulmonary effort is normal.     Breath sounds: Normal breath sounds. No wheezing, rhonchi or rales.  Neurological:     Mental Status: He is alert.  Psychiatric:        Mood and Affect: Mood normal.        Behavior: Behavior normal.     Lab Results  Component Value Date   WBC 15.8 (H) 01/16/2022   HGB 13.6 01/16/2022  HCT 43.4 01/16/2022   PLT 318 01/16/2022   GLUCOSE 166 (H) 01/16/2022   CHOL 212 (H) 01/16/2022   TRIG 208 (H) 01/16/2022   HDL 37 (L) 01/16/2022   LDLCALC 137 (H) 01/16/2022   ALT 18 01/16/2022   AST 15 01/16/2022   NA 137 01/16/2022   K 4.2 01/16/2022   CL 98 01/16/2022   CREATININE 0.85 01/16/2022   BUN 10 01/16/2022   CO2 27 01/16/2022   TSH 2.561 08/07/2009   PSA 1.2 02/10/2015   HGBA1C 7.7 (H) 01/16/2022     Assessment & Plan:   Problem List Items Addressed This Visit       Cardiovascular and Mediastinum   Essential hypertension    At goal.  Continue current medications.        Endocrine   Diabetes ([place]) - Primary    A1c today.  Mounjaro dosing increase.  Continue metformin and glipizide.      Relevant Medications   tirzepatide (MOUNJARO) 7.5 MG/0.5ML Pen   Other Relevant Orders   CMP14+EGFR   Hemoglobin A1c     Other   Hyperlipidemia     Reassessing today with lipid panel.  Continue statin.      Relevant Orders   Lipid panel   Morbid obesity ([place])   Relevant Medications   tirzepatide (MOUNJARO) 7.5 MG/0.5ML Pen   Other Visit Diagnoses     Leukocytosis, unspecified type       Relevant Orders   CBC       Meds ordered this encounter  Medications   tirzepatide (MOUNJARO) 7.5 MG/0.5ML Pen    Sig: Inject 7.5 mg into the skin once a week.    Dispense:  2 mL    Refill:  0   blood glucose meter kit and supplies KIT    Sig: Dispense based on patient and insurance preference. Use up to four times daily as directed.    Dispense:  1 each    Refill:  0    Order Specific Question:   Number of strips    Answer:   100    Order Specific Question:   Number of lancets    Answer:   100    Follow-up:  Return in about 3 months (around 10/13/2022).  [place]

## 2022-07-13 NOTE — Patient Instructions (Signed)
Labs this morning.  I increased the mounjaro.  Glucometer sent.  Follow up in 3 months.  Take care  Dr. Lacinda Axon

## 2022-07-13 NOTE — Assessment & Plan Note (Signed)
A1c today.  Mounjaro dosing increase.  Continue metformin and glipizide.

## 2022-07-13 NOTE — Assessment & Plan Note (Signed)
Reassessing today with lipid panel.  Continue statin.

## 2022-07-13 NOTE — Assessment & Plan Note (Signed)
At goal. Continue current medications. 

## 2022-07-14 ENCOUNTER — Other Ambulatory Visit: Payer: Self-pay

## 2022-07-14 DIAGNOSIS — E785 Hyperlipidemia, unspecified: Secondary | ICD-10-CM

## 2022-07-14 LAB — CMP14+EGFR
ALT: 37 IU/L (ref 0–44)
AST: 46 IU/L — ABNORMAL HIGH (ref 0–40)
Albumin/Globulin Ratio: 1.4 (ref 1.2–2.2)
Albumin: 4.2 g/dL (ref 3.8–4.9)
Alkaline Phosphatase: 92 IU/L (ref 44–121)
BUN/Creatinine Ratio: 11 (ref 9–20)
BUN: 11 mg/dL (ref 6–24)
Bilirubin Total: <0.2 mg/dL (ref 0.0–1.2)
CO2: 25 mmol/L (ref 20–29)
Calcium: 9.2 mg/dL (ref 8.7–10.2)
Chloride: 100 mmol/L (ref 96–106)
Creatinine, Ser: 0.96 mg/dL (ref 0.76–1.27)
Globulin, Total: 3.1 g/dL (ref 1.5–4.5)
Glucose: 114 mg/dL — ABNORMAL HIGH (ref 70–99)
Potassium: 4.1 mmol/L (ref 3.5–5.2)
Sodium: 140 mmol/L (ref 134–144)
Total Protein: 7.3 g/dL (ref 6.0–8.5)
eGFR: 94 mL/min/{1.73_m2} (ref >59)

## 2022-07-14 LAB — LIPID PANEL
Chol/HDL Ratio: 6 ratio — ABNORMAL HIGH (ref 0.0–5.0)
Cholesterol, Total: 174 mg/dL (ref 100–199)
HDL: 29 mg/dL — ABNORMAL LOW (ref >39)
LDL Chol Calc (NIH): 109 mg/dL — ABNORMAL HIGH (ref 0–99)
Triglycerides: 206 mg/dL — ABNORMAL HIGH (ref 0–149)
VLDL Cholesterol Cal: 36 mg/dL (ref 5–40)

## 2022-07-14 LAB — HEMOGLOBIN A1C
Est. average glucose Bld gHb Est-mCnc: 166 mg/dL
Hgb A1c MFr Bld: 7.4 % — ABNORMAL HIGH (ref 4.8–5.6)

## 2022-07-14 LAB — CBC
Hematocrit: 42.9 % (ref 37.5–51.0)
Hemoglobin: 13.7 g/dL (ref 13.0–17.7)
MCH: 24.7 pg — ABNORMAL LOW (ref 26.6–33.0)
MCHC: 31.9 g/dL (ref 31.5–35.7)
MCV: 77 fL — ABNORMAL LOW (ref 79–97)
Platelets: 326 10*3/uL (ref 150–450)
RBC: 5.55 x10E6/uL (ref 4.14–5.80)
RDW: 15.1 % (ref 11.6–15.4)
WBC: 10 10*3/uL (ref 3.4–10.8)

## 2022-07-14 MED ORDER — ATORVASTATIN CALCIUM 40 MG PO TABS: 40.0000 mg | ORAL_TABLET | Freq: Every day | ORAL | 3 refills | Status: DC

## 2022-07-17 ENCOUNTER — Other Ambulatory Visit: Payer: Self-pay | Admitting: Family Medicine

## 2022-07-17 MED ORDER — ACCU-CHEK SOFTCLIX LANCETS MISC: 5 refills | Status: DC

## 2022-07-17 MED ORDER — BLOOD GLUCOSE TEST STRIPS 333 VI STRP: ORAL_STRIP | 5 refills | Status: AC

## 2022-08-19 ENCOUNTER — Other Ambulatory Visit: Payer: Self-pay | Admitting: Family Medicine

## 2022-08-21 NOTE — Telephone Encounter (Signed)
Patient is requesting another month refill for mounjaro 7.5 mg to be sent to [facility] [address]. Please advise

## 2022-08-23 ENCOUNTER — Other Ambulatory Visit: Payer: Self-pay | Admitting: Family Medicine

## 2022-08-23 NOTE — Telephone Encounter (Signed)
Spoke with patient , he requests to have 7.5 mg weekly for one more month .

## 2022-08-29 ENCOUNTER — Encounter: Payer: Self-pay | Admitting: Nurse Practitioner

## 2022-08-29 ENCOUNTER — Ambulatory Visit (INDEPENDENT_AMBULATORY_CARE_PROVIDER_SITE_OTHER): Payer: Managed Care, Other (non HMO) | Admitting: Nurse Practitioner

## 2022-08-29 ENCOUNTER — Ambulatory Visit: Payer: Managed Care, Other (non HMO) | Admitting: Family Medicine

## 2022-08-29 VITALS — BP 186/102 | HR 100 | Temp 97.2°F | Wt 283.0 lb

## 2022-08-29 DIAGNOSIS — I10 Essential (primary) hypertension: Secondary | ICD-10-CM

## 2022-08-29 DIAGNOSIS — M545 Low back pain, unspecified: Secondary | ICD-10-CM | POA: Diagnosis not present

## 2022-08-29 MED ORDER — IBUPROFEN 600 MG PO TABS: 600.0000 mg | ORAL_TABLET | Freq: Three times a day (TID) | ORAL | 0 refills | Status: DC | PRN

## 2022-08-29 NOTE — Progress Notes (Signed)
   Subjective:    Patient ID: Caleb Ballard, male    DOB: April 23, 1966, 56 y.o.   MRN: [id number]  HPI Pt arrives with upper leg pain. Pt states he dropped his pen on Friday at work and when he went to pick it up, he felt a tremendous pain. Pt went to Urgent Care on [address]. Pt states the pain in back has subsided but pain in upper thigh is still there. Pt noticed some tingling on Thursday but did not think anything of it. Pt reports tightness of left upper thigh and numbness is standing to long. Urgent Care placed pt on Prednisone and Metaxalone 800 mg. Pt requesting work excuse for rest of week.    Review of Systems     Objective:   Physical Exam        Assessment & Plan:

## 2022-08-30 ENCOUNTER — Encounter: Payer: Self-pay | Admitting: Nurse Practitioner

## 2022-08-31 NOTE — Progress Notes (Signed)
08/31/22- pt contacted and verbalized understanding. Pt set up for Monday 09/18/22 (pt is out of town and has dentist appt on Monday so he is off work that day) Pt also states that PT is unable to see him until 10/10/22

## 2022-09-05 ENCOUNTER — Other Ambulatory Visit: Payer: Self-pay | Admitting: *Deleted

## 2022-09-05 DIAGNOSIS — E119 Type 2 diabetes mellitus without complications: Secondary | ICD-10-CM

## 2022-09-05 MED ORDER — GLIPIZIDE 5 MG PO TABS
10.0000 mg | ORAL_TABLET | Freq: Two times a day (BID) | ORAL | 0 refills | Status: DC
Start: 2022-09-05 — End: 2023-04-02

## 2022-09-07 ENCOUNTER — Other Ambulatory Visit: Payer: Self-pay | Admitting: Family Medicine

## 2022-09-09 ENCOUNTER — Other Ambulatory Visit: Payer: Self-pay | Admitting: Family Medicine

## 2022-09-11 ENCOUNTER — Other Ambulatory Visit: Payer: Self-pay | Admitting: Family Medicine

## 2022-09-18 ENCOUNTER — Ambulatory Visit: Payer: Managed Care, Other (non HMO)

## 2022-10-09 ENCOUNTER — Ambulatory Visit (HOSPITAL_COMMUNITY): Payer: Managed Care, Other (non HMO) | Admitting: Physical Therapy

## 2022-10-13 ENCOUNTER — Encounter: Payer: Self-pay | Admitting: Family Medicine

## 2022-10-13 ENCOUNTER — Ambulatory Visit (INDEPENDENT_AMBULATORY_CARE_PROVIDER_SITE_OTHER): Payer: Managed Care, Other (non HMO) | Admitting: Family Medicine

## 2022-10-13 ENCOUNTER — Ambulatory Visit (HOSPITAL_COMMUNITY)
Admission: RE | Admit: 2022-10-13 | Discharge: 2022-10-13 | Disposition: A | Discharge status: Home / Self Care | Payer: Managed Care, Other (non HMO) | Source: Ambulatory Visit | Attending: Family Medicine | Admitting: Family Medicine

## 2022-10-13 VITALS — BP 130/74 | HR 83 | Temp 97.3°F | Wt 288.0 lb

## 2022-10-13 DIAGNOSIS — I10 Essential (primary) hypertension: Secondary | ICD-10-CM

## 2022-10-13 DIAGNOSIS — E119 Type 2 diabetes mellitus without complications: Secondary | ICD-10-CM

## 2022-10-13 DIAGNOSIS — E785 Hyperlipidemia, unspecified: Secondary | ICD-10-CM | POA: Diagnosis not present

## 2022-10-13 DIAGNOSIS — E669 Obesity, unspecified: Secondary | ICD-10-CM | POA: Insufficient documentation

## 2022-10-13 DIAGNOSIS — G8929 Other chronic pain: Secondary | ICD-10-CM | POA: Insufficient documentation

## 2022-10-13 DIAGNOSIS — M5442 Lumbago with sciatica, left side: Secondary | ICD-10-CM | POA: Diagnosis present

## 2022-10-13 MED ORDER — TIRZEPATIDE 10 MG/0.5ML ~~LOC~~ SOAJ: 10.0000 mg | SUBCUTANEOUS | 0 refills | Status: DC

## 2022-10-13 NOTE — Assessment & Plan Note (Signed)
A1c today to reassess.  Continue metformin and glipizide.  Increasing Mounjaro to improve glycemic control as well as weight loss.

## 2022-10-13 NOTE — Patient Instructions (Signed)
Xray at the hospital.  Continue medication.  Follow-up in 3 months.  Labs today.

## 2022-10-13 NOTE — Progress Notes (Signed)
Subjective:  Patient ID: Caleb Ballard, male    DOB: 1966/05/08  Age: 56 y.o. MRN: [id number]  CC: Chief Complaint  Patient presents with   Diabetes    Still having pain and leg-was suppose to take PT Oct 17 but that has moved to November 3rd. Having tingling and numbness in left leg. Sugars have been good.    HPI:  56 year old male with type 2 diabetes, obesity, hypertension, hyperlipidemia presents for follow-up.  Patient previously seen for low back pain in September.  Reports ongoing issues.  He states that the pain is improved.  However, he still has some discomfort as well as numbness and tingling in his left thigh.  He is currently wearing a back brace.  He states that he has not started physical therapy.  Patient reports that his blood sugars seem to be doing fairly well.  Needs A1c today.  Currently on Mounjaro 7.5 mg, metformin, and glipizide.  Hypertension is well controlled on benazepril HCTZ and amlodipine.  In regards to his lipids, needs recheck of lipid panel.  Currently on Lipitor 40 mg.  Patient Active Problem List   Diagnosis Date Noted   Obesity (BMI 30-39.9) 10/13/2022   Chronic low back pain with left-sided sciatica 10/13/2022   Diabetes ([place]) 04/13/2013   Hyperlipidemia 02/14/2007   Essential hypertension 02/14/2007   GERD 02/14/2007    Social Hx   Social History   Socioeconomic History   Marital status: Single    Spouse name: Not on file   Number of children: Not on file   Years of education: Not on file   Highest education level: Not on file  Occupational History   Not on file  Tobacco Use   Smoking status: Never   Smokeless tobacco: Never  Vaping Use   Vaping Use: Never used  Substance and Sexual Activity   Alcohol use: Not Currently   Drug use: Not Currently   Sexual activity: Not on file  Other Topics Concern   Not on file  Social History Narrative   Not on file   Social Determinants of Health   Financial Resource Strain:  Not on file  Food Insecurity: Not on file  Transportation Needs: Not on file  Physical Activity: Not on file  Stress: Not on file  Social Connections: Not on file    Review of Systems Per HPI  Objective:  BP 130/74   Pulse 83   Temp (!) 97.3 F (36.3 C)   Wt 288 lb (130.6 kg)   SpO2 97%   BMI 39.06 kg/m      10/13/2022    8:38 AM 09/18/2022    1:25 PM 08/29/2022    1:36 PM  BP/Weight  Systolic BP 384 536 468  Diastolic BP 74 78 032  Wt. (Lbs) 288  283  BMI 39.06 kg/m2  38.38 kg/m2    Physical Exam Constitutional:      Appearance: Normal appearance. He is obese.  HENT:     Head: Normocephalic and atraumatic.  Cardiovascular:     Rate and Rhythm: Normal rate and regular rhythm.  Pulmonary:     Effort: Pulmonary effort is normal.     Breath sounds: Normal breath sounds. No wheezing, rhonchi or rales.  Neurological:     Mental Status: He is alert.  Psychiatric:        Mood and Affect: Mood normal.        Behavior: Behavior normal.     Lab Results  Component  Value Date   WBC 10.0 07/13/2022   HGB 13.7 07/13/2022   HCT 42.9 07/13/2022   PLT 326 07/13/2022   GLUCOSE 114 (H) 07/13/2022   CHOL 174 07/13/2022   TRIG 206 (H) 07/13/2022   HDL 29 (L) 07/13/2022   LDLCALC 109 (H) 07/13/2022   ALT 37 07/13/2022   AST 46 (H) 07/13/2022   NA 140 07/13/2022   K 4.1 07/13/2022   CL 100 07/13/2022   CREATININE 0.96 07/13/2022   BUN 11 07/13/2022   CO2 25 07/13/2022   TSH 2.561 08/07/2009   PSA 1.2 02/10/2015   HGBA1C 7.4 (H) 07/13/2022     Assessment & Plan:   Problem List Items Addressed This Visit       Cardiovascular and Mediastinum   Essential hypertension    At goal.  Continue current medications.        Endocrine   Diabetes ([place]) - Primary    A1c today to reassess.  Continue metformin and glipizide.  Increasing Mounjaro to improve glycemic control as well as weight loss.      Relevant Medications   tirzepatide (MOUNJARO) 10 MG/0.5ML Pen    Other Relevant Orders   Hemoglobin A1c   Microalbumin / creatinine urine ratio     Nervous and Auditory   Chronic low back pain with left-sided sciatica    X-ray today for further evaluation.      Relevant Orders   DG Lumbar Spine Complete     Other   Hyperlipidemia    Lipid panel today to reassess.  Continue statin.      Relevant Orders   Lipid panel    Meds ordered this encounter  Medications   tirzepatide (MOUNJARO) 10 MG/0.5ML Pen    Sig: Inject 10 mg into the skin once a week.    Dispense:  6 mL    Refill:  0    Follow-up:  Return in about 3 months (around 01/13/2023).  [place]

## 2022-10-13 NOTE — Assessment & Plan Note (Signed)
X-ray today for further evaluation. 

## 2022-10-13 NOTE — Assessment & Plan Note (Signed)
Lipid panel today to reassess.  Continue statin.

## 2022-10-13 NOTE — Assessment & Plan Note (Signed)
At goal. Continue current medications. 

## 2022-10-14 LAB — LIPID PANEL
Chol/HDL Ratio: 4.2 ratio (ref 0.0–5.0)
Cholesterol, Total: 138 mg/dL (ref 100–199)
HDL: 33 mg/dL — ABNORMAL LOW (ref >39)
LDL Chol Calc (NIH): 85 mg/dL (ref 0–99)
Triglycerides: 109 mg/dL (ref 0–149)
VLDL Cholesterol Cal: 20 mg/dL (ref 5–40)

## 2022-10-14 LAB — HEMOGLOBIN A1C
Est. average glucose Bld gHb Est-mCnc: 163 mg/dL
Hgb A1c MFr Bld: 7.3 % — ABNORMAL HIGH (ref 4.8–5.6)

## 2022-10-14 LAB — MICROALBUMIN / CREATININE URINE RATIO
Creatinine, Urine: 102.5 mg/dL
Microalb/Creat Ratio: 4 mg/g creat (ref 0–29)
Microalbumin, Urine: 4 ug/mL

## 2022-10-26 NOTE — Therapy (Signed)
OUTPATIENT PHYSICAL THERAPY THORACOLUMBAR EVALUATION   Patient Name: Caleb Ballard MRN: [id number] DOB:1966/01/21, 56 y.o., male Today's Date: 10/27/2022   PT End of Session - 10/27/22 1648     Visit Number 1    Number of Visits 8    Date for PT Re-Evaluation 12/01/22   pt will be out of town for Thanksgiving   [place] - Visit Number 1    Authorization - Number of Visits 60    Progress Note Due on Visit 8    PT Start Time 1600    PT Stop Time 3614    PT Time Calculation (min) 45 min    Activity Tolerance Patient tolerated treatment well    Behavior During Therapy WFL for tasks assessed/performed             Past Medical History:  Diagnosis Date   Diabetes mellitus without complication ([place])    Type II   Hyperlipidemia    Hypertension    Ruptured lumbar disc    injection x 3   Past Surgical History:  Procedure Laterality Date   APPENDECTOMY     COLONOSCOPY N/A 10/30/2018   Procedure: COLONOSCOPY;  Surgeon: Danie Binder, MD;  [facility];  Service: Endoscopy;  Laterality: N/A;  1:00   POLYPECTOMY  10/30/2018   Procedure: POLYPECTOMY;  Surgeon: Danie Binder, MD;  [facility];  Service: Endoscopy;;  cecal polyp, ascending polyps x2   TONSILLECTOMY     Patient Active Problem List   Diagnosis Date Noted   Obesity (BMI 30-39.9) 10/13/2022   Chronic low back pain with left-sided sciatica 10/13/2022   Diabetes ([place]) 04/13/2013   Hyperlipidemia 02/14/2007   Essential hypertension 02/14/2007   GERD 02/14/2007    PCP: Thersa Salt  REFERRING PROVIDER: Claire Shown, NP  REFERRING DIAG:  M54.50 (ICD-10-CM) - Acute left-sided low back pain without sciatica   Rationale for Evaluation and Treatment: Rehabilitation  THERAPY DIAG:  Left lumbar radiculopathy, muscle weakness   ONSET DATE: Early September  SUBJECTIVE:                                                                                                                                                                                            SUBJECTIVE STATEMENT: Pt states he was at work and bent down to pick up a pen and he had severe pain when he pulled up.  He was taken to urgent care and given mm relaxer and prednisone.  He went to his own MD who referred him to therapy.  He could not get into  therapy until now.  At first he could not sit or stand for any time.   He was having severe pain and numbness down his left leg but the pain got better and he is now using a back brace.  He feels that he is 90% better but he can tell that his leg is not as strong as it was.  PT states he does not feel he has a good concept on proper body mechanics.   PERTINENT HISTORY:  HTN, DM, chronic radiculopathy  PAIN:  Are you having pain? No worst pain in the past week has been a 2  PRECAUTIONS: None  WEIGHT BEARING RESTRICTIONS: No  FALLS:  Has patient fallen in last 6 months? No  LIVING ENVIRONMENT: Lives with: lives with their family Lives in: House/apartment Stairs: Yes: External: 5 steps; on right going up Has following equipment at home: None  OCCUPATION: shift manager some pulling/pushing   PLOF: Independent with basic ADLs  PATIENT GOALS: less pain, for his Lt leg to be stronger and for this not to happen again.    OBJECTIVE:   DIAGNOSTIC FINDINGS:  IMPRESSION: No acute osseous abnormality.  Mild degenerative changes.    Electronically Signed   By: Merilyn Baba M.D.   On: 10/13/2022 16:27   PATIENT SURVEYS:  FOTO 61   POSTURE: rounded shoulders, forward head, decreased lumbar lordosis, and increased thoracic kyphosis   LUMBAR ROM:   AROM eval  Flexion Reps cause  Extension 20 Reps cause no change  Right lateral flexion   Left lateral flexion   Right rotation   Left rotation    (Blank rows = not tested)   LOWER EXTREMITY MMT:    MMT Right eval Left eval  Hip flexion 5/5 3/5  Hip extension 3-/5  3-/5  Hip abduction  4-/5  Hip adduction    Hip internal rotation    Hip external rotation    Knee flexion 5/5 4/5  Knee extension 5/5 3+/5  Ankle dorsiflexion 5/5 5/5  Ankle plantarflexion    Ankle inversion    Ankle eversion     (Blank rows = not tested)  FUNCTIONAL TESTS:  30 seconds chair stand test: 7  2 minute walk test: 602 ft  Single leg stance:  Rt:  7" , Lt: 12"     TODAY'S TREATMENT:                                                                                                                              DATE: 10/27/2022 Evaluation: Sitting: abdominal set x 10              Lt LAQ x 10 Prone:  POEx1 minute              Press up x 5 Standing extension x 10   PATIENT EDUCATION:  Education details: HEP Person educated: Patient Education method: Theatre stage manager Education comprehension: returned demonstration  HOME EXERCISE PROGRAM:  Access Code: [email or web address] URL: [email or web address] Date: 10/27/2022 Prepared by: Rayetta Humphrey  Exercises - Standing Lumbar Extension  - 4-6 x daily - 7 x weekly - 1 sets - 5-10 reps - 2" hold - Seated Transversus Abdominis Bracing  - 4-6 x daily - 7 x weekly - 1 sets - 10 reps - 5" hold - Seated Long Arc Quad  - 3 x daily - 7 x weekly - 1 sets - 10 reps - 3-5 hold - Prone on Elbows Stretch  - 1 x daily - 7 x weekly - 1 sets - 2 reps - 60" hold - Prone Press Up  - 1 x daily - 7 x weekly - 1 sets - 10 reps - 2-3" hold  ASSESSMENT:  CLINICAL IMPRESSION: Patient is a 56 y.o. male who was seen today for physical therapy evaluation and treatment for Left lumbar radiculopathy.  Evaluation demonstrates decreased ROM, decreased strength, decreased balance, postural dysfunction, decreased knowledge of proper body mechanics and increased pain.  Therapist educated pt in HNP and how flexion will increase chances of sx returning while extension will decrease chances of sx returning.  Educated pt in body mechanics for bed  mobility but this will need to be revisited.  Mr. Demontay will benefit from skilled PT to address these issues and maximize his functional ability.   OBJECTIVE IMPAIRMENTS: decreased activity tolerance, decreased balance, decreased ROM, decreased strength, impaired flexibility, postural dysfunction, and pain.   ACTIVITY LIMITATIONS: carrying, lifting, bending, standing, and locomotion level  PARTICIPATION LIMITATIONS: shopping, community activity, and yard work  PERSONAL FACTORS: Fitness and Time since onset of injury/illness/exacerbation are also affecting patient's functional outcome.   REHAB POTENTIAL: Good  CLINICAL DECISION MAKING: Stable/uncomplicated  EVALUATION COMPLEXITY: Low   GOALS: Goals reviewed with patient? No  SHORT TERM GOALS: Target date: 11/10/2022  PT will be I in HEP in order to decrease his pain to no greater than a 2/10 throughout the day with radicular sx to mid thigh only Baseline: Goal status: INITIAL  2.  PT core and LE strength to increase 1/2 grade to allow pt to be able to rise from a chair without difficulty. Baseline:  Goal status: INITIAL  3.  Pt to be aware and completing proper body mechanics for bed mobility and lifting to decrease stress from lumbar area.  Baseline:  Goal status: INITIAL   LONG TERM GOALS: Target date: 12/01/2022(pt will not be out of town for a week)  Pt to be I in advanced HEP in order to decrease his pain to 0 throughout the day      Goal status: INITIAL  2.  Pt core and LE strength to be at least a 4+/5 to allow pt to be able to tolerate full workday without back pain or feeling as if his Lt leg is going to give way.  Baseline:  Goal status: INITIAL  3.  Pt to have no more radicular sx to demonstrate decreased nerve irritation  Baseline:  Goal status: INITIAL    PLAN:  PT FREQUENCY: 2x/week  PT DURATION: 4 weeks  PLANNED INTERVENTIONS: Therapeutic exercises, Therapeutic activity, Balance training,  Patient/Family education, Self Care, and Manual therapy.  PLAN FOR NEXT SESSION: Continue with extension based exercises and  Core and LE strengthening with lumbar stabilization.  Educate in lifting, review bed mobility.   Rayetta Humphrey, [place] CLT [phone]

## 2022-10-27 ENCOUNTER — Ambulatory Visit (HOSPITAL_COMMUNITY): Attending: Nurse Practitioner | Admitting: Physical Therapy

## 2022-10-27 DIAGNOSIS — M5416 Radiculopathy, lumbar region: Secondary | ICD-10-CM | POA: Diagnosis present

## 2022-10-27 DIAGNOSIS — M545 Low back pain, unspecified: Secondary | ICD-10-CM | POA: Diagnosis not present

## 2022-10-31 ENCOUNTER — Ambulatory Visit (HOSPITAL_COMMUNITY): Admitting: Physical Therapy

## 2022-10-31 DIAGNOSIS — M5416 Radiculopathy, lumbar region: Secondary | ICD-10-CM | POA: Diagnosis not present

## 2022-10-31 NOTE — Therapy (Signed)
OUTPATIENT PHYSICAL THERAPY THORACOLUMBAR EVALUATION   Patient Name: Caleb Ballard MRN: [id number] DOB:[birth year], 56 y.o., male Today's Date: 10/31/2022   PT End of Session - 10/31/22 1640     Visit Number 2    Number of Visits 8    Date for PT Re-Evaluation 12/01/22   pt will be out of town for Thanksgiving   [place] - Visit Number 2    Authorization - Number of Visits 60    Progress Note Due on Visit 8    PT Start Time 1640    PT Stop Time 1720    PT Time Calculation (min) 40 min    Activity Tolerance Patient tolerated treatment well    Behavior During Therapy WFL for tasks assessed/performed             Past Medical History:  Diagnosis Date   Diabetes mellitus without complication ([place])    Type II   Hyperlipidemia    Hypertension    Ruptured lumbar disc    injection x 3   Past Surgical History:  Procedure Laterality Date   APPENDECTOMY     COLONOSCOPY N/A 10/30/2018   Procedure: COLONOSCOPY;  Surgeon: Danie Binder, MD;  [facility];  Service: Endoscopy;  Laterality: N/A;  1:00   POLYPECTOMY  10/30/2018   Procedure: POLYPECTOMY;  Surgeon: Danie Binder, MD;  [facility];  Service: Endoscopy;;  cecal polyp, ascending polyps x2   TONSILLECTOMY     Patient Active Problem List   Diagnosis Date Noted   Obesity (BMI 30-39.9) 10/13/2022   Chronic low back pain with left-sided sciatica 10/13/2022   Diabetes ([place]) 04/13/2013   Hyperlipidemia 02/14/2007   Essential hypertension 02/14/2007   GERD 02/14/2007    PCP: Thersa Salt  REFERRING PROVIDER: Claire Shown, NP  REFERRING DIAG:  M54.50 (ICD-10-CM) - Acute left-sided low back pain without sciatica   Rationale for Evaluation and Treatment: Rehabilitation  THERAPY DIAG:  Left lumbar radiculopathy, muscle weakness   ONSET DATE: Early September  SUBJECTIVE:                                                                                                                                                                                            SUBJECTIVE STATEMENT: Pt reports he has some tingling in his Lt LE, usually at night and if he rubs with alcohol it goes away and can rest.  Mostly doing well without issues just some LT LE weakness.   Evaluation: Pt states he was at work and bent down to pick up a pen and  he had severe pain when he pulled up.  He was taken to urgent care and given mm relaxer and prednisone.  He went to his own MD who referred him to therapy.  He could not get into therapy until now.  At first he could not sit or stand for any time.   He was having severe pain and numbness down his left leg but the pain got better and he is now using a back brace.  He feels that he is 90% better but he can tell that his leg is not as strong as it was.  PT states he does not feel he has a good concept on proper body mechanics.   PERTINENT HISTORY:  HTN, DM, chronic radiculopathy  PAIN:  Are you having pain? No worst pain in the past week has been a 2  PRECAUTIONS: None  WEIGHT BEARING RESTRICTIONS: No  FALLS:  Has patient fallen in last 6 months? No  LIVING ENVIRONMENT: Lives with: lives with their family Lives in: House/apartment Stairs: Yes: External: 5 steps; on right going up Has following equipment at home: None  OCCUPATION: shift manager some pulling/pushing   PLOF: Independent with basic ADLs  PATIENT GOALS: less pain, for his Lt leg to be stronger and for this not to happen again.    OBJECTIVE:   DIAGNOSTIC FINDINGS:  IMPRESSION: No acute osseous abnormality.  Mild degenerative changes.    Electronically Signed   By: Merilyn Baba M.D.   On: 10/13/2022 16:27   PATIENT SURVEYS:  FOTO 61   POSTURE: rounded shoulders, forward head, decreased lumbar lordosis, and increased thoracic kyphosis   LUMBAR ROM:   AROM eval  Flexion Reps cause  Extension 20 Reps cause no change  Right lateral  flexion   Left lateral flexion   Right rotation   Left rotation    (Blank rows = not tested)   LOWER EXTREMITY MMT:    MMT Right eval Left eval  Hip flexion 5/5 3/5  Hip extension 3-/5 3-/5  Hip abduction  4-/5  Hip adduction    Hip internal rotation    Hip external rotation    Knee flexion 5/5 4/5  Knee extension 5/5 3+/5  Ankle dorsiflexion 5/5 5/5  Ankle plantarflexion    Ankle inversion    Ankle eversion     (Blank rows = not tested)  FUNCTIONAL TESTS:  30 seconds chair stand test: 7  2 minute walk test: 602 ft  Single leg stance:  Rt:  7" , Lt: 12"     TODAY'S TREATMENT:                                                                                                                              10/31/22 Review of goals and POC moving forward Seated:  sit to stands no UE 10X Standing:  lumbar extension 10X  Hip abduction 10X each  Hip extension 10X each Logroll technique Supine:  bridge 10X  SLR 10X Rt, 5X2 Lt cues for extension Sidelying;  Hip abduction 10X each Prone:  Hip extension 10X each  DATE: 10/27/2022 Evaluation: Sitting: abdominal set x 10              Lt LAQ x 10 Prone:  POEx1 minute              Press up x 5 Standing extension x 10   PATIENT EDUCATION:  Education details: HEP Person educated: Patient Education method: Theatre stage manager Education comprehension: returned demonstration  HOME EXERCISE PROGRAM: Access Code: [email or web address] URL: [email or web address] Date: 10/31/2022 Prepared by: Roseanne Reno Exercises - Sidelying Hip Abduction  - 2 x daily - 7 x weekly - 2 sets - 10 reps - Standing Hip Abduction  - 2 x daily - 7 x weekly - 2 sets - 10 reps - Standing Hip Extension  - 2 x daily - 7 x weekly - 2 sets - 10 reps - Supine Bridge  - 2 x daily - 7 x weekly - 2 sets - 10 reps - Supine Active Straight Leg Raise  - 2 x daily - 7 x weekly - 2 sets - 10 reps - Sit to Stand Without Arm Support  - 2 x daily - 7 x weekly -  1 sets - 10 reps  Access Code: [email or web address] URL: [email or web address] Date: 10/27/2022 Prepared by: Rayetta Humphrey Exercises - Standing Lumbar Extension  - 4-6 x daily - 7 x weekly - 1 sets - 5-10 reps - 2" hold - Seated Transversus Abdominis Bracing  - 4-6 x daily - 7 x weekly - 1 sets - 10 reps - 5" hold - Seated Long Arc Quad  - 3 x daily - 7 x weekly - 1 sets - 10 reps - 3-5 hold - Prone on Elbows Stretch  - 1 x daily - 7 x weekly - 1 sets - 2 reps - 60" hold - Prone Press Up  - 1 x daily - 7 x weekly - 1 sets - 10 reps - 2-3" hold  ASSESSMENT:  CLINICAL IMPRESSION: Reviewed goals, HEP and POC moving forward.  Pt admits to doing all HEP with exception of prone due to discomfort/diff breathing.  Hip extensor strengthening and lumbar extension shown in standing for alternative way of completing.  Pt required cues to maintain upright posturing with activity and stabilize core to isolate activity.  Pt unable to complete LT SLR without slight extension lag and required several sets as he needed rest between sets.  Pt educated with logroll technique for bed mobility and able to demonstrate correctly. Pt will require further education in body mechanics for general lifting and functional tasks.  Updated HEP to include added strengthening exercises completed this session.  Pt will continue to benefit from skilled PT to address these issues and maximize his functional ability.   OBJECTIVE IMPAIRMENTS: decreased activity tolerance, decreased balance, decreased ROM, decreased strength, impaired flexibility, postural dysfunction, and pain.   ACTIVITY LIMITATIONS: carrying, lifting, bending, standing, and locomotion level  PARTICIPATION LIMITATIONS: shopping, community activity, and yard work  PERSONAL FACTORS: Fitness and Time since onset of injury/illness/exacerbation are also affecting patient's functional outcome.   REHAB POTENTIAL: Good  CLINICAL DECISION MAKING:  Stable/uncomplicated  EVALUATION COMPLEXITY: Low   GOALS: Goals reviewed with patient? No  SHORT TERM GOALS: Target date: 11/14/2022  PT will be I in HEP in order to decrease his pain to no greater than a 2/10 throughout the day with radicular  sx to mid thigh only Baseline: Goal status: IN PROGRESS  2.  PT core and LE strength to increase 1/2 grade to allow pt to be able to rise from a chair without difficulty. Baseline:  Goal status: IN PROGRESS  3.  Pt to be aware and completing proper body mechanics for bed mobility and lifting to decrease stress from lumbar area.  Baseline:  Goal status: IN PROGRESS   LONG TERM GOALS: Target date: 12/05/2022(pt will not be out of town for a week)  Pt to be I in advanced HEP in order to decrease his pain to 0 throughout the day      Goal status: IN PROGRESS  2.  Pt core and LE strength to be at least a 4+/5 to allow pt to be able to tolerate full workday without back pain or feeling as if his Lt leg is going to give way.  Baseline:  Goal status: IN PROGRESS  3.  Pt to have no more radicular sx to demonstrate decreased nerve irritation  Baseline:  Goal status: IN PROGRESS    PLAN:  PT FREQUENCY: 2x/week  PT DURATION: 4 weeks  PLANNED INTERVENTIONS: Therapeutic exercises, Therapeutic activity, Balance training, Patient/Family education, Self Care, and Manual therapy.  PLAN FOR NEXT SESSION: Continue with extension based exercises, core and LE strengthening with lumbar stabilization.  Progress to body mechanics/lifting techniques.    Teena Irani, PTA/CLT [place] Ph: [phone]  Teena Irani, PTA 10/31/2022, 4:43 PM

## 2022-11-08 ENCOUNTER — Ambulatory Visit (HOSPITAL_COMMUNITY): Admitting: Physical Therapy

## 2022-11-08 DIAGNOSIS — M5416 Radiculopathy, lumbar region: Secondary | ICD-10-CM | POA: Diagnosis not present

## 2022-11-08 NOTE — Therapy (Signed)
OUTPATIENT PHYSICAL THERAPY THORACOLUMBAR EVALUATION   Patient Name: Caleb Ballard MRN: [id number] DOB:03-Dec-1966, 56 y.o., male Today's Date: 11/08/2022   PT End of Session - 11/08/22 1642     Visit Number 3    Number of Visits 8    Date for PT Re-Evaluation 12/01/22   pt will be out of town for Thanksgiving   [place] - Visit Number 3    Authorization - Number of Visits 60    Progress Note Due on Visit 8    PT Start Time 1645    PT Stop Time 1726    PT Time Calculation (min) 41 min    Activity Tolerance Patient tolerated treatment well    Behavior During Therapy WFL for tasks assessed/performed             Past Medical History:  Diagnosis Date   Diabetes mellitus without complication ([place])    Type II   Hyperlipidemia    Hypertension    Ruptured lumbar disc    injection x 3   Past Surgical History:  Procedure Laterality Date   APPENDECTOMY     COLONOSCOPY N/A 10/30/2018   Procedure: COLONOSCOPY;  Surgeon: Danie Binder, MD;  [facility];  Service: Endoscopy;  Laterality: N/A;  1:00   POLYPECTOMY  10/30/2018   Procedure: POLYPECTOMY;  Surgeon: Danie Binder, MD;  Location: [facility];  Service: Endoscopy;;  cecal polyp, ascending polyps x2   TONSILLECTOMY     Patient Active Problem List   Diagnosis Date Noted   Obesity (BMI 30-39.9) 10/13/2022   Chronic low back pain with left-sided sciatica 10/13/2022   Diabetes ([place]) 04/13/2013   Hyperlipidemia 02/14/2007   Essential hypertension 02/14/2007   GERD 02/14/2007    PCP: Thersa Salt  REFERRING PROVIDER: Claire Shown, NP  REFERRING DIAG:  M54.50 (ICD-10-CM) - Acute left-sided low back pain without sciatica   Rationale for Evaluation and Treatment: Rehabilitation  THERAPY DIAG:  Left lumbar radiculopathy, muscle weakness   ONSET DATE: Early September  SUBJECTIVE:                                                                                                                                                                                            SUBJECTIVE STATEMENT:Pt has no questions on HEP but admits that he is not completing them.  PERTINENT HISTORY:  HTN, DM, chronic radiculopathy  PAIN:  Are you having pain? No worst pain in the past week has been a 1  in leg   PRECAUTIONS: None  WEIGHT BEARING RESTRICTIONS: No  FALLS:  Has patient fallen in last 6 months? No  LIVING ENVIRONMENT: Lives with: lives with their family Lives in: House/apartment Stairs: Yes: External: 5 steps; on right going up Has following equipment at home: None  OCCUPATION: shift manager some pulling/pushing   PLOF: Independent with basic ADLs  PATIENT GOALS: less pain, for his Lt leg to be stronger and for this not to happen again.    OBJECTIVE:   DIAGNOSTIC FINDINGS:  IMPRESSION: No acute osseous abnormality.  Mild degenerative changes.    Electronically Signed   By: Merilyn Baba M.D.   On: 10/13/2022 16:27   PATIENT SURVEYS:  FOTO 61   POSTURE: rounded shoulders, forward head, decreased lumbar lordosis, and increased thoracic kyphosis   LUMBAR ROM:   AROM eval  Flexion Reps cause  Extension 20 Reps cause no change  Right lateral flexion   Left lateral flexion   Right rotation   Left rotation    (Blank rows = not tested)   LOWER EXTREMITY MMT:    MMT Right eval Left eval  Hip flexion 5/5 3/5  Hip extension 3-/5 3-/5  Hip abduction  4-/5  Hip adduction    Hip internal rotation    Hip external rotation    Knee flexion 5/5 4/5  Knee extension 5/5 3+/5  Ankle dorsiflexion 5/5 5/5  Ankle plantarflexion    Ankle inversion    Ankle eversion     (Blank rows = not tested)  FUNCTIONAL TESTS:  30 seconds chair stand test: 7  2 minute walk test: 602 ft  Single leg stance:  Rt:  7" , Lt: 12"     TODAY'S TREATMENT:      11/08/22 Standing Functional squat x 10  Wall arch x 10 Prone  POE x 2' Press up x 5,   2 sets  Single leg raise x 5 2 sets  Supine:   Active hamstring x 3 B Knee to chest B  Bridge x 15 Dead bug x 10  Sitting: Sit to stand x 2 reps without UE assist, 5 with UE assist   Standing extension x 10                                                                                                                           10/31/22 Review of goals and POC moving forward Seated:  sit to stands no UE 10X Standing:  lumbar extension 10X  Hip abduction 10X each  Hip extension 10X each Logroll technique Supine:  bridge 10X  SLR 10X Rt, 5X2 Lt cues for extension Sidelying;  Hip abduction 10X each Prone:  Hip extension 10X each  DATE: 10/27/2022 Evaluation: Sitting: abdominal set x 10              Lt LAQ x 10 Prone:  POEx1 minute              Press up x 5 Standing extension x 10   PATIENT EDUCATION:  Education details:  HEP Person educated: Patient Education method: Theatre stage manager Education comprehension: returned demonstration  HOME EXERCISE PROGRAM: Access Code: [email or web address] URL: [email or web address] Date: 10/31/2022 Prepared by: Roseanne Reno Exercises - Sidelying Hip Abduction  - 2 x daily - 7 x weekly - 2 sets - 10 reps - Standing Hip Abduction  - 2 x daily - 7 x weekly - 2 sets - 10 reps - Standing Hip Extension  - 2 x daily - 7 x weekly - 2 sets - 10 reps - Supine Bridge  - 2 x daily - 7 x weekly - 2 sets - 10 reps - Supine Active Straight Leg Raise  - 2 x daily - 7 x weekly - 2 sets - 10 reps - Sit to Stand Without Arm Support  - 2 x daily - 7 x weekly - 1 sets - 10 reps  Access Code: [email or web address] URL: [email or web address] Date: 10/27/2022 Prepared by: Rayetta Humphrey Exercises - Standing Lumbar Extension  - 4-6 x daily - 7 x weekly - 1 sets - 5-10 reps - 2" hold - Seated Transversus Abdominis Bracing  - 4-6 x daily - 7 x weekly - 1 sets - 10 reps - 5" hold - Seated Long Arc Quad  - 3 x daily - 7 x weekly - 1 sets - 10 reps -  3-5 hold - Prone on Elbows Stretch  - 1 x daily - 7 x weekly - 1 sets - 2 reps - 60" hold - Prone Press Up  - 1 x daily - 7 x weekly - 1 sets - 10 reps - 2-3" hold  ASSESSMENT:  CLINICAL IMPRESSION: PT HEP not updated today due to pt not completing the exercises that he has been given.  Advanced stabilization exercises with good carry over.  Therapist noted improved strength in B gluteal maximus.  Pt will continue to benefit from skilled PT to address these issues and maximize his functional ability.   OBJECTIVE IMPAIRMENTS: decreased activity tolerance, decreased balance, decreased ROM, decreased strength, impaired flexibility, postural dysfunction, and pain.   ACTIVITY LIMITATIONS: carrying, lifting, bending, standing, and locomotion level  PARTICIPATION LIMITATIONS: shopping, community activity, and yard work  PERSONAL FACTORS: Fitness and Time since onset of injury/illness/exacerbation are also affecting patient's functional outcome.   REHAB POTENTIAL: Good  CLINICAL DECISION MAKING: Stable/uncomplicated  EVALUATION COMPLEXITY: Low   GOALS: Goals reviewed with patient? No  SHORT TERM GOALS: Target date: 11/22/2022  PT will be I in HEP in order to decrease his pain to no greater than a 2/10 throughout the day with radicular sx to mid thigh only Baseline: Goal status: IN PROGRESS  2.  PT core and LE strength to increase 1/2 grade to allow pt to be able to rise from a chair without difficulty. Baseline:  Goal status: IN PROGRESS  3.  Pt to be aware and completing proper body mechanics for bed mobility and lifting to decrease stress from lumbar area.  Baseline:  Goal status: IN PROGRESS   LONG TERM GOALS: Target date: 12/13/2022(pt will not be out of town for a week)  Pt to be I in advanced HEP in order to decrease his pain to 0 throughout the day      Goal status: IN PROGRESS  2.  Pt core and LE strength to be at least a 4+/5 to allow pt to be able to tolerate full  workday without back pain or feeling as if his Lt leg is going to give way.  Baseline:  Goal status:  IN PROGRESS  3.  Pt to have no more radicular sx to demonstrate decreased nerve irritation  Baseline:  Goal status: IN PROGRESS    PLAN:  PT FREQUENCY: 2x/week  PT DURATION: 4 weeks  PLANNED INTERVENTIONS: Therapeutic exercises, Therapeutic activity, Balance training, Patient/Family education, Self Care, and Manual therapy.  PLAN FOR NEXT SESSION: Continue with extension based exercises, core and LE strengthening with lumbar stabilization.  Continue with  body mechanics/lifting techniques.   Rayetta Humphrey, [place] [phone]  [phone]

## 2022-11-10 ENCOUNTER — Encounter (HOSPITAL_COMMUNITY): Payer: Self-pay

## 2022-11-10 ENCOUNTER — Ambulatory Visit (HOSPITAL_COMMUNITY)

## 2022-11-10 DIAGNOSIS — M5416 Radiculopathy, lumbar region: Secondary | ICD-10-CM

## 2022-11-10 NOTE — Therapy (Signed)
OUTPATIENT PHYSICAL THERAPY THORACOLUMBAR EVALUATION   Patient Name: Caleb Ballard MRN: [id number] DOB:September 23, 1966, 56 y.o., male Today's Date: 11/10/2022   PT End of Session - 11/10/22 1711     Visit Number 4    Number of Visits 8    Date for PT Re-Evaluation 12/01/22    Authorization Type [employer]    Authorization - Visit Number 4    Authorization - Number of Visits 60    Progress Note Due on Visit 8    PT Start Time 6789    PT Stop Time 1720    PT Time Calculation (min) 38 min    Activity Tolerance Patient tolerated treatment well;No increased pain    Behavior During Therapy WFL for tasks assessed/performed              Past Medical History:  Diagnosis Date   Diabetes mellitus without complication ([place])    Type II   Hyperlipidemia    Hypertension    Ruptured lumbar disc    injection x 3   Past Surgical History:  Procedure Laterality Date   APPENDECTOMY     COLONOSCOPY N/A 10/30/2018   Procedure: COLONOSCOPY;  Surgeon: Danie Binder, MD;  [facility];  Service: Endoscopy;  Laterality: N/A;  1:00   POLYPECTOMY  10/30/2018   Procedure: POLYPECTOMY;  Surgeon: Danie Binder, MD;  [facility];  Service: Endoscopy;;  cecal polyp, ascending polyps x2   TONSILLECTOMY     Patient Active Problem List   Diagnosis Date Noted   Obesity (BMI 30-39.9) 10/13/2022   Chronic low back pain with left-sided sciatica 10/13/2022   Diabetes ([place]) 04/13/2013   Hyperlipidemia 02/14/2007   Essential hypertension 02/14/2007   GERD 02/14/2007    PCP: Thersa Salt  REFERRING PROVIDER: Claire Shown, NP Next apt:  REFERRING DIAG:  M54.50 (ICD-10-CM) - Acute left-sided low back pain without sciatica   Rationale for Evaluation and Treatment: Rehabilitation  THERAPY DIAG:  Left lumbar radiculopathy, muscle weakness   ONSET DATE: Early September  SUBJECTIVE:                                                                                                                                                                                            SUBJECTIVE STATEMENT: Reports increased activities required at work yesterday.  Pt stated he has sore legs, stated he has some tingling in inner Lt thigh that has gone on all day.  Did complete some of the HEP at home without question.    PERTINENT HISTORY:  HTN, DM, chronic radiculopathy  PAIN:  Are you having pain? No  worst pain in the past week has been a 1  in leg   PRECAUTIONS: None  WEIGHT BEARING RESTRICTIONS: No  FALLS:  Has patient fallen in last 6 months? No  LIVING ENVIRONMENT: Lives with: lives with their family Lives in: House/apartment Stairs: Yes: External: 5 steps; on right going up Has following equipment at home: None  OCCUPATION: shift manager some pulling/pushing   PLOF: Independent with basic ADLs  PATIENT GOALS: less pain, for his Lt leg to be stronger and for this not to happen again.    OBJECTIVE:   DIAGNOSTIC FINDINGS:  IMPRESSION: No acute osseous abnormality.  Mild degenerative changes.    Electronically Signed   By: Merilyn Baba M.D.   On: 10/13/2022 16:27   PATIENT SURVEYS:  FOTO 61   POSTURE: rounded shoulders, forward head, decreased lumbar lordosis, and increased thoracic kyphosis   LUMBAR ROM:   AROM eval  Flexion Reps cause  Extension 20 Reps cause no change  Right lateral flexion   Left lateral flexion   Right rotation   Left rotation    (Blank rows = not tested)   LOWER EXTREMITY MMT:    MMT Right eval Left eval  Hip flexion 5/5 3/5  Hip extension 3-/5 3-/5  Hip abduction  4-/5  Hip adduction    Hip internal rotation    Hip external rotation    Knee flexion 5/5 4/5  Knee extension 5/5 3+/5  Ankle dorsiflexion 5/5 5/5  Ankle plantarflexion    Ankle inversion    Ankle eversion     (Blank rows = not tested)  FUNCTIONAL TESTS:  30 seconds chair stand test: 7  2 minute walk test: 602 ft  Single leg stance:  Rt:   7" , Lt: 12"     TODAY'S TREATMENT:      11/10/22: Standing: Functional squat then heel raise 10 Lumbar extension 10x  Wall arch with heel raise 10x 3" GTB shoulder extension 15x GTB rows 15x Proper lifting mechanics  Prone: Prone press up 5x 10" Hip extension 10x 3" BLE   11/08/22 Standing Functional squat x 10  Wall arch x 10 Prone  POE x 2' Press up x 5,  2 sets  Single leg raise x 5 2 sets  Supine:   Active hamstring x 3 B Knee to chest B  Bridge x 15 Dead bug x 10  Sitting: Sit to stand x 2 reps without UE assist, 5 with UE assist   Standing extension x 10                                                                                                                           10/31/22 Review of goals and POC moving forward Seated:  sit to stands no UE 10X Standing:  lumbar extension 10X  Hip abduction 10X each  Hip extension 10X each Logroll technique Supine:  bridge 10X  SLR 10X Rt, 5X2 Lt cues for extension Sidelying;  Hip abduction 10X each Prone:  Hip extension 10X each  DATE: 10/27/2022 Evaluation: Sitting: abdominal set x 10              Lt LAQ x 10 Prone:  POEx1 minute              Press up x 5 Standing extension x 10   PATIENT EDUCATION:  Education details: HEP Person educated: Patient Education method: Theatre stage manager Education comprehension: returned demonstration  HOME EXERCISE PROGRAM: Access Code: [email or web address] URL: [email or web address] Date: 10/31/2022 Prepared by: Roseanne Reno Exercises - Sidelying Hip Abduction  - 2 x daily - 7 x weekly - 2 sets - 10 reps - Standing Hip Abduction  - 2 x daily - 7 x weekly - 2 sets - 10 reps - Standing Hip Extension  - 2 x daily - 7 x weekly - 2 sets - 10 reps - Supine Bridge  - 2 x daily - 7 x weekly - 2 sets - 10 reps - Supine Active Straight Leg Raise  - 2 x daily - 7 x weekly - 2 sets - 10 reps - Sit to Stand Without Arm Support  - 2 x daily - 7 x weekly - 1 sets - 10  reps  Access Code: [email or web address] URL: [email or web address] Date: 10/27/2022 Prepared by: Rayetta Humphrey Exercises - Standing Lumbar Extension  - 4-6 x daily - 7 x weekly - 1 sets - 5-10 reps - 2" hold - Seated Transversus Abdominis Bracing  - 4-6 x daily - 7 x weekly - 1 sets - 10 reps - 5" hold - Seated Long Arc Quad  - 3 x daily - 7 x weekly - 1 sets - 10 reps - 3-5 hold - Prone on Elbows Stretch  - 1 x daily - 7 x weekly - 1 sets - 2 reps - 60" hold - Prone Press Up  - 1 x daily - 7 x weekly - 1 sets - 10 reps - 2-3" hold  ASSESSMENT:  CLINICAL IMPRESSION: Session focus with extension based exercises with positive feedback, pt stated radicular symptoms resolved.  Progressed stabilization exercises with good carry over.  Pt able to complete all exercises with good control and mechanics.  Educated mechanics for proper lifting with cueing to equalize weight bearing as tendency to increase weight bearing on Rt LE.  No reports of pain through session.     OBJECTIVE IMPAIRMENTS: decreased activity tolerance, decreased balance, decreased ROM, decreased strength, impaired flexibility, postural dysfunction, and pain.   ACTIVITY LIMITATIONS: carrying, lifting, bending, standing, and locomotion level  PARTICIPATION LIMITATIONS: shopping, community activity, and yard work  PERSONAL FACTORS: Fitness and Time since onset of injury/illness/exacerbation are also affecting patient's functional outcome.   REHAB POTENTIAL: Good  CLINICAL DECISION MAKING: Stable/uncomplicated  EVALUATION COMPLEXITY: Low   GOALS: Goals reviewed with patient? No  SHORT TERM GOALS: Target date: 11/24/2022  PT will be I in HEP in order to decrease his pain to no greater than a 2/10 throughout the day with radicular sx to mid thigh only Baseline: Goal status: IN PROGRESS  2.  PT core and LE strength to increase 1/2 grade to allow pt to be able to rise from a chair without difficulty. Baseline:  Goal  status: IN PROGRESS  3.  Pt to be aware and completing proper body mechanics for bed mobility and lifting to decrease stress from lumbar area.  Baseline:  Goal status: IN PROGRESS   LONG TERM GOALS: Target date: 12/15/2022(pt will  not be out of town for a week)  Pt to be I in advanced HEP in order to decrease his pain to 0 throughout the day      Goal status: IN PROGRESS  2.  Pt core and LE strength to be at least a 4+/5 to allow pt to be able to tolerate full workday without back pain or feeling as if his Lt leg is going to give way.  Baseline:  Goal status: IN PROGRESS  3.  Pt to have no more radicular sx to demonstrate decreased nerve irritation  Baseline:  Goal status: IN PROGRESS    PLAN:  PT FREQUENCY: 2x/week  PT DURATION: 4 weeks  PLANNED INTERVENTIONS: Therapeutic exercises, Therapeutic activity, Balance training, Patient/Family education, Self Care, and Manual therapy.  PLAN FOR NEXT SESSION: Continue with extension based exercises, core and LE strengthening with lumbar stabilization.  Continue with  body mechanics/lifting techniques.   Ihor Austin, LPTA/CLT; Delana Meyer [phone]

## 2022-11-21 ENCOUNTER — Encounter (HOSPITAL_COMMUNITY): Payer: Self-pay

## 2022-11-21 ENCOUNTER — Ambulatory Visit (HOSPITAL_COMMUNITY)

## 2022-11-21 DIAGNOSIS — M5416 Radiculopathy, lumbar region: Secondary | ICD-10-CM

## 2022-11-21 NOTE — Therapy (Signed)
OUTPATIENT PHYSICAL THERAPY THORACOLUMBAR TREATMENT   Patient Name: Caleb Ballard MRN: [id number] DOB:[birth year], 56 y.o., male Today's Date: 11/21/2022   PT End of Session - 11/21/22 1702     Visit Number 5    Number of Visits 8    Date for PT Re-Evaluation 12/01/22    Authorization Type [employer]    Authorization - Visit Number 5    Authorization - Number of Visits 60    Progress Note Due on Visit 8    PT Start Time 7425    PT Stop Time 1726    PT Time Calculation (min) 38 min    Activity Tolerance Patient tolerated treatment well;No increased pain    Behavior During Therapy WFL for tasks assessed/performed               Past Medical History:  Diagnosis Date   Diabetes mellitus without complication ([place])    Type II   Hyperlipidemia    Hypertension    Ruptured lumbar disc    injection x 3   Past Surgical History:  Procedure Laterality Date   APPENDECTOMY     COLONOSCOPY N/A 10/30/2018   Procedure: COLONOSCOPY;  Surgeon: Danie Binder, MD;  [facility];  Service: Endoscopy;  Laterality: N/A;  1:00   POLYPECTOMY  10/30/2018   Procedure: POLYPECTOMY;  Surgeon: Danie Binder, MD;  [facility]: [facility];  Service: Endoscopy;;  cecal polyp, ascending polyps x2   TONSILLECTOMY     Patient Active Problem List   Diagnosis Date Noted   Obesity (BMI 30-39.9) 10/13/2022   Chronic low back pain with left-sided sciatica 10/13/2022   Diabetes ([place]) 04/13/2013   Hyperlipidemia 02/14/2007   Essential hypertension 02/14/2007   GERD 02/14/2007    PCP: Thersa Salt  REFERRING PROVIDER: Claire Shown, NP Next apt:  REFERRING DIAG:  M54.50 (ICD-10-CM) - Acute left-sided low back pain without sciatica   Rationale for Evaluation and Treatment: Rehabilitation  THERAPY DIAG:  Left lumbar radiculopathy, muscle weakness   ONSET DATE: Early September  SUBJECTIVE:                                                                                                                                                                                            SUBJECTIVE STATEMENT: Reports increased activities required at work yesterday.  Pt stated he has sore legs, stated he has some tingling in inner Lt thigh that has gone on all day.  Did complete some of the HEP at home without question.   Pt stated pain comes and goes inner thigh.   PERTINENT HISTORY:  HTN, DM,  chronic radiculopathy  PAIN:  Are you having pain? No worst pain in the past week has been a 1  in leg   PRECAUTIONS: None  WEIGHT BEARING RESTRICTIONS: No  FALLS:  Has patient fallen in last 6 months? No  LIVING ENVIRONMENT: Lives with: lives with their family Lives in: House/apartment Stairs: Yes: External: 5 steps; on right going up Has following equipment at home: None  OCCUPATION: shift manager some pulling/pushing   PLOF: Independent with basic ADLs  PATIENT GOALS: less pain, for his Lt leg to be stronger and for this not to happen again.    OBJECTIVE:   DIAGNOSTIC FINDINGS:  IMPRESSION: No acute osseous abnormality.  Mild degenerative changes.    Electronically Signed   By: Merilyn Baba M.D.   On: 10/13/2022 16:27   PATIENT SURVEYS:  FOTO 61   POSTURE: rounded shoulders, forward head, decreased lumbar lordosis, and increased thoracic kyphosis   LUMBAR ROM:   AROM eval  Flexion Reps cause  Extension 20 Reps cause no change  Right lateral flexion   Left lateral flexion   Right rotation   Left rotation    (Blank rows = not tested)   LOWER EXTREMITY MMT:    MMT Right eval Left eval  Hip flexion 5/5 3/5  Hip extension 3-/5 3-/5  Hip abduction  4-/5  Hip adduction    Hip internal rotation    Hip external rotation    Knee flexion 5/5 4/5  Knee extension 5/5 3+/5  Ankle dorsiflexion 5/5 5/5  Ankle plantarflexion    Ankle inversion    Ankle eversion     (Blank rows = not tested)  FUNCTIONAL TESTS:  30 seconds chair stand test: 7  2  minute walk test: 602 ft  Single leg stance:  Rt:  7" , Lt: 12"     TODAY'S TREATMENT:      11/21/22: Standing:  Wall arch with heel raise 10x 5" Squat then extend 10x SLS Lt 18" Rt 20" Vector 3x 5" Tandem stance on foam 1x 30" Sidestep minisquat 2RT GTB around knee Picking up small items x 3 Lt knee buckled Forward step up 6in 10 Lt  Lateral step up 4in 10x  Lt  Quadruped: Opposite UE/LE extra foam under knees 5x 5"  11/10/22: Standing: Functional squat then heel raise 10 Lumbar extension 10x  Wall arch with heel raise 10x 3" GTB shoulder extension 15x GTB rows 15x Proper lifting mechanics  Prone: Prone press up 5x 10" Hip extension 10x 3" BLE   11/08/22 Standing Functional squat x 10  Wall arch x 10 Prone  POE x 2' Press up x 5,  2 sets  Single leg raise x 5 2 sets  Supine:   Active hamstring x 3 B Knee to chest B  Bridge x 15 Dead bug x 10  Sitting: Sit to stand x 2 reps without UE assist, 5 with UE assist   Standing extension x 10  10/31/22 Review of goals and POC moving forward Seated:  sit to stands no UE 10X Standing:  lumbar extension 10X  Hip abduction 10X each  Hip extension 10X each Logroll technique Supine:  bridge 10X  SLR 10X Rt, 5X2 Lt cues for extension Sidelying;  Hip abduction 10X each Prone:  Hip extension 10X each  DATE: 10/27/2022 Evaluation: Sitting: abdominal set x 10              Lt LAQ x 10 Prone:  POEx1 minute              Press up x 5 Standing extension x 10   PATIENT EDUCATION:  Education details: HEP Person educated: Patient Education method: Theatre stage manager Education comprehension: returned demonstration  HOME EXERCISE PROGRAM: Access Code: [email or web address] URL: [email or web address] Date: 10/31/2022 Prepared by: Roseanne Reno Exercises - Sidelying Hip Abduction  - 2 x daily  - 7 x weekly - 2 sets - 10 reps - Standing Hip Abduction  - 2 x daily - 7 x weekly - 2 sets - 10 reps - Standing Hip Extension  - 2 x daily - 7 x weekly - 2 sets - 10 reps - Supine Bridge  - 2 x daily - 7 x weekly - 2 sets - 10 reps - Supine Active Straight Leg Raise  - 2 x daily - 7 x weekly - 2 sets - 10 reps - Sit to Stand Without Arm Support  - 2 x daily - 7 x weekly - 1 sets - 10 reps  Access Code: [email or web address] URL: [email or web address] Date: 10/27/2022 Prepared by: Rayetta Humphrey Exercises - Standing Lumbar Extension  - 4-6 x daily - 7 x weekly - 1 sets - 5-10 reps - 2" hold - Seated Transversus Abdominis Bracing  - 4-6 x daily - 7 x weekly - 1 sets - 10 reps - 5" hold - Seated Long Arc Quad  - 3 x daily - 7 x weekly - 1 sets - 10 reps - 3-5 hold - Prone on Elbows Stretch  - 1 x daily - 7 x weekly - 1 sets - 2 reps - 60" hold - Prone Press Up  - 1 x daily - 7 x weekly - 1 sets - 10 reps - 2-3" hold  11/10/22:  GTB postural strengthening.  ASSESSMENT:  CLINICAL IMPRESSION: Progressed stability exercises this session for core and gluteal strengthening, pt tolerated well with no reports of pain of radicular symptoms.  Added quadruped UE/LE, vector stance and dynamic surface with intermittent HHA required.  Reviewed proper lifting small items off floor, encouraged to utilize opposite UE/leg extension for balance, 3rd rep Lt knee buckled.  Added quad strengthening exercises with positive reports.  OBJECTIVE IMPAIRMENTS: decreased activity tolerance, decreased balance, decreased ROM, decreased strength, impaired flexibility, postural dysfunction, and pain.   ACTIVITY LIMITATIONS: carrying, lifting, bending, standing, and locomotion level  PARTICIPATION LIMITATIONS: shopping, community activity, and yard work  PERSONAL FACTORS: Fitness and Time since onset of injury/illness/exacerbation are also affecting patient's functional outcome.   REHAB POTENTIAL: Good  CLINICAL  DECISION MAKING: Stable/uncomplicated  EVALUATION COMPLEXITY: Low   GOALS: Goals reviewed with patient? No  SHORT TERM GOALS: Target date: 12/05/2022  PT will be I in HEP in order to decrease his pain to no greater than a 2/10 throughout the day with radicular sx to mid thigh only Baseline: Goal status: IN PROGRESS  2.  PT core and LE strength to increase 1/2 grade to allow pt to be able  to rise from a chair without difficulty. Baseline:  Goal status: IN PROGRESS  3.  Pt to be aware and completing proper body mechanics for bed mobility and lifting to decrease stress from lumbar area.  Baseline:  Goal status: IN PROGRESS   LONG TERM GOALS: Target date: 12/26/2022(pt will not be out of town for a week)  Pt to be I in advanced HEP in order to decrease his pain to 0 throughout the day      Goal status: IN PROGRESS  2.  Pt core and LE strength to be at least a 4+/5 to allow pt to be able to tolerate full workday without back pain or feeling as if his Lt leg is going to give way.  Baseline:  Goal status: IN PROGRESS  3.  Pt to have no more radicular sx to demonstrate decreased nerve irritation  Baseline:  Goal status: IN PROGRESS    PLAN:  PT FREQUENCY: 2x/week  PT DURATION: 4 weeks  PLANNED INTERVENTIONS: Therapeutic exercises, Therapeutic activity, Balance training, Patient/Family education, Self Care, and Manual therapy.  PLAN FOR NEXT SESSION: Continue with extension based exercises, core and LE strengthening with lumbar stabilization.  Continue with  body mechanics/lifting techniques.   Ihor Austin, LPTA/CLT; Delana Meyer [phone]

## 2022-11-23 ENCOUNTER — Ambulatory Visit (HOSPITAL_COMMUNITY): Admitting: Physical Therapy

## 2022-11-23 DIAGNOSIS — M5416 Radiculopathy, lumbar region: Secondary | ICD-10-CM

## 2022-11-23 NOTE — Therapy (Signed)
OUTPATIENT PHYSICAL THERAPY THORACOLUMBAR TREATMENT   Patient Name: Caleb Ballard MRN: [id number] DOB:September 13, 1966, 56 y.o., male Today's Date: 11/23/2022   PT End of Session - 11/23/22 1730    Visit Number 6    Number of Visits 8    Date for PT Re-Evaluation 12/01/22    Authorization Type [employer]    Authorization - Visit Number 6    Authorization - Number of Visits 60    Progress Note Due on Visit 8    PT Start Time 2202    PT Stop Time 1730    PT Time Calculation (min) 42 min    Activity Tolerance Patient tolerated treatment well;No increased pain    Behavior During Therapy WFL for tasks assessed/performed               Past Medical History:  Diagnosis Date   Diabetes mellitus without complication ([place])    Type II   Hyperlipidemia    Hypertension    Ruptured lumbar disc    injection x 3   Past Surgical History:  Procedure Laterality Date   APPENDECTOMY     COLONOSCOPY N/A 10/30/2018   Procedure: COLONOSCOPY;  Surgeon: Danie Binder, MD;  [facility];  Service: Endoscopy;  Laterality: N/A;  1:00   POLYPECTOMY  10/30/2018   Procedure: POLYPECTOMY;  Surgeon: Danie Binder, MD;  [facility];  Service: Endoscopy;;  cecal polyp, ascending polyps x2   TONSILLECTOMY     Patient Active Problem List   Diagnosis Date Noted   Obesity (BMI 30-39.9) 10/13/2022   Chronic low back pain with left-sided sciatica 10/13/2022   Diabetes ([place]) 04/13/2013   Hyperlipidemia 02/14/2007   Essential hypertension 02/14/2007   GERD 02/14/2007    PCP: Thersa Salt  REFERRING PROVIDER: Claire Shown, NP Next apt:  REFERRING DIAG:  M54.50 (ICD-10-CM) - Acute left-sided low back pain without sciatica   Rationale for Evaluation and Treatment: Rehabilitation  THERAPY DIAG:  Left lumbar radiculopathy, muscle weakness   ONSET DATE: Early September  SUBJECTIVE:                                                                                                                                                                                            SUBJECTIVE STATEMENT:  Pt states that he feels that he is about 55% better at this time.  He has no questions on the exercises. PERTINENT HISTORY:  HTN, DM, chronic radiculopathy  PAIN:  Are you having pain? No worst pain in the past week has been a 1  in leg   PRECAUTIONS: None  WEIGHT BEARING  RESTRICTIONS: No  FALLS:  Has patient fallen in last 6 months? No  LIVING ENVIRONMENT: Lives with: lives with their family Lives in: House/apartment Stairs: Yes: External: 5 steps; on right going up Has following equipment at home: None  OCCUPATION: shift manager some pulling/pushing   PLOF: Independent with basic ADLs  PATIENT GOALS: less pain, for his Lt leg to be stronger and for this not to happen again.    OBJECTIVE:   DIAGNOSTIC FINDINGS:  IMPRESSION: No acute osseous abnormality.  Mild degenerative changes.    Electronically Signed   By: Merilyn Baba M.D.   On: 10/13/2022 16:27   PATIENT SURVEYS:  FOTO 61   POSTURE: rounded shoulders, forward head, decreased lumbar lordosis, and increased thoracic kyphosis   LUMBAR ROM:   AROM eval  Flexion Reps cause  Extension 20 Reps cause no change  Right lateral flexion   Left lateral flexion   Right rotation   Left rotation    (Blank rows = not tested)   LOWER EXTREMITY MMT:    MMT Right eval Left eval  Hip flexion 5/5 3/5  Hip extension 3-/5 3-/5  Hip abduction  4-/5  Hip adduction    Hip internal rotation    Hip external rotation    Knee flexion 5/5 4/5  Knee extension 5/5 3+/5  Ankle dorsiflexion 5/5 5/5  Ankle plantarflexion    Ankle inversion    Ankle eversion     (Blank rows = not tested)  FUNCTIONAL TESTS:  30 seconds chair stand test: 7  2 minute walk test: 602 ft  Single leg stance:  Rt:  7" , Lt: 12"     TODAY'S TREATMENT:    11/23/22 Wall squat x 10 Back extension with glut squeeze x 10   Postural exercises with green theraband  Scapular retraction x 10 Rows x 10 Shoulder extension x 10 Palof x 10  Prone: POE x 1 minute Press up x 10  Hip extension x 10 Quadriped: Opposite arm/leg raise x 10 Supine: Bridge 10" x 10 reps Active hamstring stretch B x 30" x 2   11/21/22: Standing:  Wall arch with heel raise 10x 5" Squat then extend 10x SLS Lt 18" Rt 20" Vector 3x 5" Tandem stance on foam 1x 30" Sidestep minisquat 2RT GTB around knee Picking up small items x 3 Lt knee buckled Forward step up 6in 10 Lt  Lateral step up 4in 10x  Lt  Quadruped: Opposite UE/LE extra foam under knees 5x 5"  11/10/22: Standing: Functional squat then heel raise 10 Lumbar extension 10x  Wall arch with heel raise 10x 3" GTB shoulder extension 15x GTB rows 15x Proper lifting mechanics  Prone: Prone press up 5x 10" Hip extension 10x 3" BLE   11/08/22 Standing Functional squat x 10  Wall arch x 10 Prone  POE x 2' Press up x 5,  2 sets  Single leg raise x 5 2 sets  Supine:   Active hamstring x 3 B Knee to chest B  Bridge x 15 Dead bug x 10  Sitting: Sit to stand x 2 reps without UE assist, 5 with UE assist   Standing extension x 10  10/31/22 Review of goals and POC moving forward Seated:  sit to stands no UE 10X Standing:  lumbar extension 10X  Hip abduction 10X each  Hip extension 10X each Logroll technique Supine:  bridge 10X  SLR 10X Rt, 5X2 Lt cues for extension Sidelying;  Hip abduction 10X each Prone:  Hip extension 10X each  DATE: 10/27/2022 Evaluation: Sitting: abdominal set x 10              Lt LAQ x 10 Prone:  POEx1 minute              Press up x 5 Standing extension x 10   PATIENT EDUCATION:  Education details: HEP Person educated: Patient Education method: Theatre stage manager Education comprehension: returned  demonstration  HOME EXERCISE PROGRAM: 11/30: Standing postural T band with green theraband (3)  Anti-rotation in sqat position. Access Code: [email or web address] URL: [email or web address] Date: 10/31/2022 Prepared by: Roseanne Reno Exercises - Sidelying Hip Abduction  - 2 x daily - 7 x weekly - 2 sets - 10 reps - Standing Hip Abduction  - 2 x daily - 7 x weekly - 2 sets - 10 reps - Standing Hip Extension  - 2 x daily - 7 x weekly - 2 sets - 10 reps - Supine Bridge  - 2 x daily - 7 x weekly - 2 sets - 10 reps - Supine Active Straight Leg Raise  - 2 x daily - 7 x weekly - 2 sets - 10 reps - Sit to Stand Without Arm Support  - 2 x daily - 7 x weekly - 1 sets - 10 reps  Access Code: [email or web address] URL: [email or web address] Date: 10/27/2022 Prepared by: Rayetta Humphrey Exercises - Standing Lumbar Extension  - 4-6 x daily - 7 x weekly - 1 sets - 5-10 reps - 2" hold - Seated Transversus Abdominis Bracing  - 4-6 x daily - 7 x weekly - 1 sets - 10 reps - 5" hold - Seated Long Arc Quad  - 3 x daily - 7 x weekly - 1 sets - 10 reps - 3-5 hold - Prone on Elbows Stretch  - 1 x daily - 7 x weekly - 1 sets - 2 reps - 60" hold - Prone Press Up  - 1 x daily - 7 x weekly - 1 sets - 10 reps - 2-3" hold  11/10/22:  GTB postural strengthening.  ASSESSMENT:  CLINICAL IMPRESSION: Pt HEP updated with postural 3 and anti rotation exercise.  PT given body mechanic sheet.  PT has improved form for quadriped exercises.  PT continues to have strength deficits and will continue to benefit from skilled PT to reduce the risk of reinjury.  OBJECTIVE IMPAIRMENTS: decreased activity tolerance, decreased balance, decreased ROM, decreased strength, impaired flexibility, postural dysfunction, and pain.   ACTIVITY LIMITATIONS: carrying, lifting, bending, standing, and locomotion level  PARTICIPATION LIMITATIONS: shopping, community activity, and yard work  PERSONAL FACTORS: Fitness and Time since onset of  injury/illness/exacerbation are also affecting patient's functional outcome.   REHAB POTENTIAL: Good  CLINICAL DECISION MAKING: Stable/uncomplicated  EVALUATION COMPLEXITY: Low   GOALS: Goals reviewed with patient? No  SHORT TERM GOALS: Target date: 12/07/2022  PT will be I in HEP in order to decrease his pain to no greater than a 2/10 throughout the day with radicular sx to mid thigh only Baseline: Goal status: IN PROGRESS  2.  PT core and LE strength to increase 1/2 grade to allow pt to be able to rise from a  chair without difficulty. Baseline:  Goal status: IN PROGRESS  3.  Pt to be aware and completing proper body mechanics for bed mobility and lifting to decrease stress from lumbar area.  Baseline:  Goal status: IN PROGRESS   LONG TERM GOALS: Target date: 12/28/2022(pt will not be out of town for a week)  Pt to be I in advanced HEP in order to decrease his pain to 0 throughout the day      Goal status: IN PROGRESS  2.  Pt core and LE strength to be at least a 4+/5 to allow pt to be able to tolerate full workday without back pain or feeling as if his Lt leg is going to give way.  Baseline:  Goal status: IN PROGRESS  3.  Pt to have no more radicular sx to demonstrate decreased nerve irritation  Baseline:  Goal status: IN PROGRESS    PLAN:  PT FREQUENCY: 2x/week  PT DURATION: 4 weeks  PLANNED INTERVENTIONS: Therapeutic exercises, Therapeutic activity, Balance training, Patient/Family education, Self Care, and Manual therapy.  PLAN FOR NEXT SESSION: Continue with extension based exercises, core and LE strengthening with lumbar stabilization.  Continue with  body mechanics/lifting techniques.   Rayetta Humphrey, [place] CLT [phone]  [phone]

## 2022-11-27 ENCOUNTER — Ambulatory Visit (HOSPITAL_COMMUNITY): Attending: Nurse Practitioner | Admitting: Physical Therapy

## 2022-11-27 DIAGNOSIS — M5416 Radiculopathy, lumbar region: Secondary | ICD-10-CM | POA: Diagnosis present

## 2022-11-27 NOTE — Therapy (Signed)
OUTPATIENT PHYSICAL THERAPY THORACOLUMBAR TREATMENT   Patient Name: Caleb Ballard MRN: [id number] DOB:09/15/1966, 56 y.o., male Today's Date: 11/27/2022 .PHYSICAL THERAPY DISCHARGE SUMMARY  Visits from Start of Care: 7  Current functional level related to goals / functional outcomes: See below   Remaining deficits: See  below   Education / Equipment: Posture, body mechanics and exercises    Patient agrees to discharge. Patient goals were partially met. Patient is being discharged due to being pleased with the current functional level.   PT End of Session - 11/27/22    Visit Number 7    Number of Visits 7   Date for PT Re-Evaluation 12/01/22    Authorization Type [employer]    Authorization - Visit Number 7    Authorization - Number of Visits 60    Progress Note Due on Visit 7   PT Start Time 0354   PT Stop Time 6568    PT Time Calculation (min) 29 min    Activity Tolerance Patient tolerated treatment well;No increased pain    Behavior During Therapy WFL for tasks assessed/performed               Past Medical History:  Diagnosis Date   Diabetes mellitus without complication ([place])    Type II   Hyperlipidemia    Hypertension    Ruptured lumbar disc    injection x 3   Past Surgical History:  Procedure Laterality Date   APPENDECTOMY     COLONOSCOPY N/A 10/30/2018   Procedure: COLONOSCOPY;  Surgeon: Danie Binder, MD;  [facility];  Service: Endoscopy;  Laterality: N/A;  1:00   POLYPECTOMY  10/30/2018   Procedure: POLYPECTOMY;  Surgeon: Danie Binder, MD;  Location: [facility];  Service: Endoscopy;;  cecal polyp, ascending polyps x2   TONSILLECTOMY     Patient Active Problem List   Diagnosis Date Noted   Obesity (BMI 30-39.9) 10/13/2022   Chronic low back pain with left-sided sciatica 10/13/2022   Diabetes ([place]) 04/13/2013   Hyperlipidemia 02/14/2007   Essential hypertension 02/14/2007   GERD 02/14/2007    PCP: Thersa Salt  REFERRING  PROVIDER: Claire Shown, NP Next apt:  REFERRING DIAG:  M54.50 (ICD-10-CM) - Acute left-sided low back pain without sciatica   Rationale for Evaluation and Treatment: Rehabilitation  THERAPY DIAG:  Left lumbar radiculopathy, muscle weakness   ONSET DATE: Early September  SUBJECTIVE:  SUBJECTIVE STATEMENT:  Pt states that his first day back at work was today and he was really surprised as he had no difficulty and had quite a bit of lifting.  HTN, DM, chronic radiculopathy  PAIN:  Are you having pain? No worst pain in the past week has been a 1  in leg   PRECAUTIONS: None  WEIGHT BEARING RESTRICTIONS: No  FALLS:  Has patient fallen in last 6 months? No  LIVING ENVIRONMENT: Lives with: lives with their family Lives in: House/apartment Stairs: Yes: External: 5 steps; on right going up Has following equipment at home: None  OCCUPATION: shift manager some pulling/pushing   PLOF: Independent with basic ADLs  PATIENT GOALS: less pain, for his Lt leg to be stronger and for this not to happen again.    OBJECTIVE:   DIAGNOSTIC FINDINGS:  IMPRESSION: No acute osseous abnormality.  Mild degenerative changes.    Electronically Signed   By: Merilyn Baba M.D.   On: 10/13/2022 16:27   PATIENT SURVEYS:  FOTO 61            11/27/22:  77   POSTURE: rounded shoulders, forward head, decreased lumbar lordosis, and increased thoracic kyphosis   LUMBAR ROM:   AROM eval  Flexion Reps cause  Extension 20 Reps cause no change  Right lateral flexion   Left lateral flexion   Right rotation   Left rotation    (Blank rows = not tested)   LOWER EXTREMITY MMT:    MMT Right eval 11/27/22 Left eval 11/27/22  Hip flexion 5/5  3/5 5/5  Hip extension 3-/5 3/5 3-/5 4+/5  Hip abduction   4-/5  4+/5  Hip adduction      Hip internal rotation      Hip external rotation      Knee flexion 5/5  4/5 5/5  Knee extension 5/5  3+/5 4+/5  Ankle dorsiflexion 5/5  5/5   Ankle plantarflexion      Ankle inversion      Ankle eversion       (Blank rows = not tested)  FUNCTIONAL TESTS:  30 seconds chair stand test: 7  2 minute walk test: 602 ft  Single leg stance:  Rt:  7" , Lt: 12"   12/4:  30" sit to stand:  10           2 minutes test: 667'            Single leg stance:  RT: 60" Lt 58"   TODAY'S TREATMENT:  12/4: Reassessment see above Sit to stand x 10 Prone : Press up x 5 Hip extension x 10 B   11/23/22 Wall squat x 10 Back extension with glut squeeze x 10  Postural exercises with green theraband  Scapular retraction x 10 Rows x 10 Shoulder extension x 10 Palof x 10  Prone: POE x 1 minute Press up x 10  Hip extension x 10 Quadriped: Opposite arm/leg raise x 10 Supine: Bridge 10" x 10 reps Active hamstring stretch B x 30" x 2   11/21/22: Standing:  Wall arch with heel raise 10x 5" Squat then extend 10x SLS Lt 18" Rt 20" Vector 3x 5" Tandem stance on foam 1x 30" Sidestep minisquat 2RT GTB around knee Picking up small items x 3 Lt knee buckled Forward step up 6in 10 Lt  Lateral step up 4in 10x  Lt  Quadruped: Opposite UE/LE extra foam under knees 5x 5"  11/10/22: Standing: Functional squat then  heel raise 10 Lumbar extension 10x  Wall arch with heel raise 10x 3" GTB shoulder extension 15x GTB rows 15x Proper lifting mechanics  Prone: Prone press up 5x 10" Hip extension 10x 3" BLE   11/08/22 Standing Functional squat x 10  Wall arch x 10 Prone  POE x 2' Press up x 5,  2 sets  Single leg raise x 5 2 sets  Supine:   Active hamstring x 3 B Knee to chest B  Bridge x 15 Dead bug x 10  Sitting: Sit to stand x 2 reps without UE assist, 5 with UE assist   Standing extension x 10                                                                                                                            10/31/22 Review of goals and POC moving forward Seated:  sit to stands no UE 10X Standing:  lumbar extension 10X  Hip abduction 10X each  Hip extension 10X each Logroll technique Supine:  bridge 10X  SLR 10X Rt, 5X2 Lt cues for extension Sidelying;  Hip abduction 10X each Prone:  Hip extension 10X each  DATE: 10/27/2022 Evaluation: Sitting: abdominal set x 10              Lt LAQ x 10 Prone:  POEx1 minute              Press up x 5 Standing extension x 10   PATIENT EDUCATION:  Education details: HEP Person educated: Patient Education method: Theatre stage manager Education comprehension: returned demonstration  HOME EXERCISE PROGRAM: 11/30: Standing postural T band with green theraband (3)  Anti-rotation in sqat position. Access Code: [email or web address] URL: [email or web address] Date: 10/31/2022 Prepared by: Roseanne Reno Exercises - Sidelying Hip Abduction  - 2 x daily - 7 x weekly - 2 sets - 10 reps - Standing Hip Abduction  - 2 x daily - 7 x weekly - 2 sets - 10 reps - Standing Hip Extension  - 2 x daily - 7 x weekly - 2 sets - 10 reps - Supine Bridge  - 2 x daily - 7 x weekly - 2 sets - 10 reps - Supine Active Straight Leg Raise  - 2 x daily - 7 x weekly - 2 sets - 10 reps - Sit to Stand Without Arm Support  - 2 x daily - 7 x weekly - 1 sets - 10 reps  Access Code: [email or web address] URL: [email or web address] Date: 10/27/2022 Prepared by: Rayetta Humphrey Exercises - Standing Lumbar Extension  - 4-6 x daily - 7 x weekly - 1 sets - 5-10 reps - 2" hold - Seated Transversus Abdominis Bracing  - 4-6 x daily - 7 x weekly - 1 sets - 10 reps - 5" hold - Seated Long Arc Quad  - 3 x daily - 7 x weekly - 1 sets - 10 reps - 3-5 hold - Prone on Elbows Stretch  - 1  x daily - 7 x weekly - 1 sets - 2 reps - 60" hold - Prone Press Up  - 1 x daily - 7 x weekly - 1 sets - 10 reps - 2-3" hold  11/10/22:  GTB  postural strengthening.  ASSESSMENT:  CLINICAL IMPRESSION: Pt reassessed today as today was his first day back at work and he had no problem.  Pt has done well meeting all of his STG and 1/3 of his LTG.  Pt LTG not  met is pain a 0/10 which he is 80% of the time now  but will still have some pain if he has had a very physical day.  PT also occasionally has pain going into LT hip but not down his leg like it was.  Pt feels I in HEP and body mechanics and will work on achieving all goals at home.   OBJECTIVE IMPAIRMENTS: decreased activity tolerance, decreased balance, decreased ROM, decreased strength, impaired flexibility, postural dysfunction, and pain.   ACTIVITY LIMITATIONS: carrying, lifting, bending, standing, and locomotion level  PARTICIPATION LIMITATIONS: shopping, community activity, and yard work  PERSONAL FACTORS: Fitness and Time since onset of injury/illness/exacerbation are also affecting patient's functional outcome.   REHAB POTENTIAL: Good  CLINICAL DECISION MAKING: Stable/uncomplicated  EVALUATION COMPLEXITY: Low   GOALS: Goals reviewed with patient? No  SHORT TERM GOALS: Target date: 12/11/2022  PT will be I in HEP in order to decrease his pain to no greater than a 2/10 throughout the day with radicular sx to mid thigh only Baseline: Goal status: MET  2.  PT core and LE strength to increase 1/2 grade to allow pt to be able to rise from a chair without difficulty. Baseline:  Goal status: MET  3.  Pt to be aware and completing proper body mechanics for bed mobility and lifting to decrease stress from lumbar area.  Baseline:  Goal status: MET   LONG TERM GOALS: Target date: 01/01/2023(pt will not be out of town for a week)  Pt to be I in advanced HEP in order to decrease his pain to 0 throughout the day      Goal status: IN PROGRESS  2.  Pt core and LE strength to be at least a 4+/5 to allow pt to be able to tolerate full workday without back pain or feeling  as if his Lt leg is going to give way.  Baseline:  Goal status: MET  3.  Pt to have no more radicular sx to demonstrate decreased nerve irritation  Baseline:  Goal status: IN PROGRESS    PLAN:  PT FREQUENCY: 2x/week  PT DURATION: 4 weeks  PLANNED INTERVENTIONS: Therapeutic exercises, Therapeutic activity, Balance training, Patient/Family education, Self Care, and Manual therapy.  PLAN FOR NEXT SESSION: Discharge to [facility], [place] [phone]  [phone]

## 2022-11-29 ENCOUNTER — Encounter (HOSPITAL_COMMUNITY): Payer: Managed Care, Other (non HMO)

## 2022-12-28 ENCOUNTER — Other Ambulatory Visit: Payer: Self-pay | Admitting: Family Medicine

## 2023-01-09 ENCOUNTER — Other Ambulatory Visit: Payer: Self-pay | Admitting: Family Medicine

## 2023-01-09 DIAGNOSIS — E119 Type 2 diabetes mellitus without complications: Secondary | ICD-10-CM

## 2023-01-12 ENCOUNTER — Encounter: Payer: Self-pay | Admitting: Family Medicine

## 2023-01-12 ENCOUNTER — Ambulatory Visit (INDEPENDENT_AMBULATORY_CARE_PROVIDER_SITE_OTHER): Payer: Managed Care, Other (non HMO) | Admitting: Family Medicine

## 2023-01-12 VITALS — BP 132/73 | HR 102 | Wt 287.2 lb

## 2023-01-12 DIAGNOSIS — E785 Hyperlipidemia, unspecified: Secondary | ICD-10-CM

## 2023-01-12 DIAGNOSIS — E119 Type 2 diabetes mellitus without complications: Secondary | ICD-10-CM | POA: Diagnosis not present

## 2023-01-12 DIAGNOSIS — I10 Essential (primary) hypertension: Secondary | ICD-10-CM

## 2023-01-12 NOTE — Patient Instructions (Signed)
A1C today.  Continue your current medications.  Follow up in 3 months (unless A1C is at goal then we can do 6 months).

## 2023-01-13 LAB — HEMOGLOBIN A1C
Est. average glucose Bld gHb Est-mCnc: 146 mg/dL
Hgb A1c MFr Bld: 6.7 % — ABNORMAL HIGH (ref 4.8–5.6)

## 2023-01-14 NOTE — Progress Notes (Signed)
Subjective:  Patient ID: Caleb Ballard, male    DOB: 10-14-1966  Age: 57 y.o. MRN: [id number]  CC: Chief Complaint  Patient presents with   Diabetes    Pt arrives for follow up on DM. Checking sugars BID. Sees eye dr.     HPI:  57 year old male with Obesity, HTN, DM-2, HLD presents for follow up.  Patient's hypertension is stable on amlodipine and benazepril/HCTZ.  Patient's blood sugars have been improving.  He is on Mounjaro, glipizide, and metformin.  Needs A1c.  He does report some nausea with Mounjaro.  Hyperlipidemia has been fairly well-controlled with atorvastatin.   Patient Active Problem List   Diagnosis Date Noted   Obesity (BMI 30-39.9) 10/13/2022   Chronic low back pain with left-sided sciatica 10/13/2022   Diabetes ([place]) 04/13/2013   Hyperlipidemia 02/14/2007   Essential hypertension 02/14/2007   GERD 02/14/2007    Social Hx   Social History   Socioeconomic History   Marital status: Single    Spouse name: Not on file   Number of children: Not on file   Years of education: Not on file   Highest education level: Not on file  Occupational History   Not on file  Tobacco Use   Smoking status: Never   Smokeless tobacco: Never  Vaping Use   Vaping Use: Never used  Substance and Sexual Activity   Alcohol use: Not Currently   Drug use: Not Currently   Sexual activity: Not on file  Other Topics Concern   Not on file  Social History Narrative   Not on file   Social Determinants of Health   Financial Resource Strain: Not on file  Food Insecurity: Not on file  Transportation Needs: Not on file  Physical Activity: Not on file  Stress: Not on file  Social Connections: Not on file    Review of Systems  Gastrointestinal:  Positive for nausea.   Per HPI  Objective:  BP 132/73   Pulse (!) 102   Wt 287 lb 3.2 oz (130.3 kg)   SpO2 99%   BMI 38.95 kg/m      01/12/2023    2:15 PM 01/12/2023    1:48 PM 01/12/2023    1:45 PM  BP/Weight   Systolic BP 366 440 347  Diastolic BP 73 72 77  Wt. (Lbs)   287.2  BMI   38.95 kg/m2    Physical Exam Vitals and nursing note reviewed.  Constitutional:      General: He is not in acute distress.    Appearance: Normal appearance. He is obese.  HENT:     Head: Normocephalic and atraumatic.  Eyes:     General:        Right eye: No discharge.        Left eye: No discharge.     Conjunctiva/sclera: Conjunctivae normal.  Cardiovascular:     Rate and Rhythm: Normal rate and regular rhythm.  Pulmonary:     Effort: Pulmonary effort is normal.     Breath sounds: Normal breath sounds. No wheezing, rhonchi or rales.  Neurological:     Mental Status: He is alert.     Lab Results  Component Value Date   WBC 10.0 07/13/2022   HGB 13.7 07/13/2022   HCT 42.9 07/13/2022   PLT 326 07/13/2022   GLUCOSE 114 (H) 07/13/2022   CHOL 138 10/13/2022   TRIG 109 10/13/2022   HDL 33 (L) 10/13/2022   LDLCALC 85 10/13/2022  ALT 37 07/13/2022   AST 46 (H) 07/13/2022   NA 140 07/13/2022   K 4.1 07/13/2022   CL 100 07/13/2022   CREATININE 0.96 07/13/2022   BUN 11 07/13/2022   CO2 25 07/13/2022   TSH 2.561 08/07/2009   PSA 1.2 02/10/2015   HGBA1C 6.7 (H) 01/12/2023     Assessment & Plan:   Problem List Items Addressed This Visit       Cardiovascular and Mediastinum   Essential hypertension    Stable.  Continue current medications.        Endocrine   Diabetes ([place]) - Primary    A1c has returned at goal at 6.7.  Continue Mounjaro, metformin, glipizide.      Relevant Orders   Hemoglobin A1c (Completed)     Other   Hyperlipidemia    Stable on Lipitor.  Continue.       Follow-up:  Return in about 3 months (around 04/13/2023).  [place]

## 2023-01-14 NOTE — Assessment & Plan Note (Signed)
Stable on Lipitor.  Continue. 

## 2023-01-14 NOTE — Assessment & Plan Note (Signed)
A1c has returned at goal at 6.7.  Continue Mounjaro, metformin, glipizide.

## 2023-01-14 NOTE — Assessment & Plan Note (Signed)
Stable.  Continue current medications.

## 2023-01-29 ENCOUNTER — Other Ambulatory Visit: Payer: Self-pay | Admitting: Family Medicine

## 2023-03-09 ENCOUNTER — Telehealth: Payer: Self-pay

## 2023-03-09 ENCOUNTER — Other Ambulatory Visit: Payer: Self-pay | Admitting: Family Medicine

## 2023-03-09 MED ORDER — TIRZEPATIDE 12.5 MG/0.5ML ~~LOC~~ SOAJ: 12.5000 mg | SUBCUTANEOUS | 1 refills | Status: DC

## 2023-03-09 NOTE — Telephone Encounter (Signed)
Prescription Request  03/09/2023  LOV: Visit date not found  What is the name of the medication or equipment? MOUNJARO 10 MG/0.5ML Pen  they do not have but they do have the 12.5 mg and pt is wanting to know if this can be sent in as well pharmacy is holding   Have you contacted your pharmacy to request a refill? Yes   Which pharmacy would you like this sent to?  [facility] [phone] - [place], [place] AT [place] [id number] [place] [address] Phone: [phone] Fax: [phone]    Patient notified that their request is being sent to the clinical staff for review and that they should receive a response within 2 business days.   Please advise at Mobile [phone] (mobile)

## 2023-03-09 NOTE — Telephone Encounter (Signed)
Cook, Jayce G, DO     Rx sent.   

## 2023-03-30 ENCOUNTER — Other Ambulatory Visit: Payer: Self-pay | Admitting: Family Medicine

## 2023-03-30 DIAGNOSIS — E119 Type 2 diabetes mellitus without complications: Secondary | ICD-10-CM

## 2023-04-10 ENCOUNTER — Ambulatory Visit (INDEPENDENT_AMBULATORY_CARE_PROVIDER_SITE_OTHER): Payer: Managed Care, Other (non HMO) | Admitting: Family Medicine

## 2023-04-10 DIAGNOSIS — R718 Other abnormality of red blood cells: Secondary | ICD-10-CM

## 2023-04-10 DIAGNOSIS — I10 Essential (primary) hypertension: Secondary | ICD-10-CM | POA: Diagnosis not present

## 2023-04-10 DIAGNOSIS — E119 Type 2 diabetes mellitus without complications: Secondary | ICD-10-CM

## 2023-04-10 DIAGNOSIS — E785 Hyperlipidemia, unspecified: Secondary | ICD-10-CM

## 2023-04-10 NOTE — Patient Instructions (Signed)
Continue your meds.  Labs on/after 4/19.  Follow up in 3 months (pending lab results).

## 2023-04-11 NOTE — Assessment & Plan Note (Signed)
Elevated today.  Will continue amlodipine and benazepril/HCTZ.  If elevated on follow-up visit we will increase benazepril.

## 2023-04-11 NOTE — Progress Notes (Signed)
Subjective:  Patient ID: Caleb Ballard, male    DOB: 09-Aug-1966  Age: 57 y.o. MRN: [id number]  CC: Chief Complaint  Patient presents with   Hypertension    HPI:  57 year old male with hypertension, GERD, type 2 diabetes, obesity, and hyperlipidemia presents for follow-up.  Patient's BP is mildly elevated here today.  He endorses compliance with treatment.  He states his blood pressures are typically better controlled when he is not in the office.  He is currently on benazepril/HCTZ 20-25 and amlodipine 10 mg.  His type 2 diabetes has been well-controlled since addition of Mounjaro and increasing the dose.  He states that he is tolerating 12.5 mg well.  He does not wish to increase it at this time.  He is also on metformin and glipizide and denies any side effects.  He does note that he has to be careful to make sure he eats regularly or he will get hypoglycemic with the glipizide.  I am hopeful that we can decrease this in the near future based off his A1c.  Hyperlipidemia has been fairly well-controlled.  Last LDL 85.  He is compliant with Lipitor.  Patient Active Problem List   Diagnosis Date Noted   Obesity (BMI 30-39.9) 10/13/2022   Chronic low back pain with left-sided sciatica 10/13/2022   Diabetes 04/13/2013   Hyperlipidemia 02/14/2007   Essential hypertension 02/14/2007   GERD 02/14/2007    Social Hx   Social History   Socioeconomic History   Marital status: Single    Spouse name: Not on file   Number of children: Not on file   Years of education: Not on file   Highest education level: Not on file  Occupational History   Not on file  Tobacco Use   Smoking status: Never   Smokeless tobacco: Never  Vaping Use   Vaping Use: Never used  Substance and Sexual Activity   Alcohol use: Not Currently   Drug use: Not Currently   Sexual activity: Not on file  Other Topics Concern   Not on file  Social History Narrative   Not on file   Social Determinants of  Health   Financial Resource Strain: Not on file  Food Insecurity: Not on file  Transportation Needs: Not on file  Physical Activity: Not on file  Stress: Not on file  Social Connections: Not on file    Review of Systems  Constitutional: Negative.   Respiratory: Negative.    Cardiovascular: Negative.     Objective:  BP (!) 158/88   Pulse 81   Ht 6' (1.829 m)   Wt 289 lb (131.1 kg)   SpO2 97%   BMI 39.20 kg/m      04/10/2023    3:31 PM 04/10/2023    2:46 PM 01/12/2023    2:15 PM  BP/Weight  Systolic BP 158 144 132  Diastolic BP 88 90 73  Wt. (Lbs)  289   BMI  39.2 kg/m2     Physical Exam Vitals and nursing note reviewed.  Constitutional:      Appearance: Normal appearance.  Cardiovascular:     Rate and Rhythm: Normal rate and regular rhythm.  Pulmonary:     Effort: Pulmonary effort is normal.     Breath sounds: Normal breath sounds. No wheezing, rhonchi or rales.  Abdominal:     General: There is no distension.     Palpations: Abdomen is soft.     Tenderness: There is no abdominal tenderness.  Neurological:  Mental Status: He is alert.  Psychiatric:        Mood and Affect: Mood normal.        Behavior: Behavior normal.     Lab Results  Component Value Date   WBC 10.0 07/13/2022   HGB 13.7 07/13/2022   HCT 42.9 07/13/2022   PLT 326 07/13/2022   GLUCOSE 114 (H) 07/13/2022   CHOL 138 10/13/2022   TRIG 109 10/13/2022   HDL 33 (L) 10/13/2022   LDLCALC 85 10/13/2022   ALT 37 07/13/2022   AST 46 (H) 07/13/2022   NA 140 07/13/2022   K 4.1 07/13/2022   CL 100 07/13/2022   CREATININE 0.96 07/13/2022   BUN 11 07/13/2022   CO2 25 07/13/2022   TSH 2.561 08/07/2009   PSA 1.2 02/10/2015   HGBA1C 6.7 (H) 01/12/2023     Assessment & Plan:   Problem List Items Addressed This Visit       Cardiovascular and Mediastinum   Essential hypertension    Elevated today.  Will continue amlodipine and benazepril/HCTZ.  If elevated on follow-up visit we will  increase benazepril.      Relevant Orders   CMP14+EGFR     Endocrine   Diabetes    Has been at goal.  Repeating A1c.  I offered to increase his Mounjaro.  He declined.  Continue Mounjaro, metformin, and glipizide.  If his A1c remains well-controlled, will start decreasing glipizide.      Relevant Orders   Hemoglobin A1c   Microalbumin / creatinine urine ratio     Other   Hyperlipidemia    I would like better control.  If LDL is not below 70 on his lab work, will increase Lipitor.      Relevant Orders   Lipid panel   Other Visit Diagnoses     Microcytosis       Relevant Orders   CBC       Follow-up:  Return in about 3 months (around 07/10/2023).  Everlene Other DO [facility]

## 2023-04-11 NOTE — Assessment & Plan Note (Signed)
I would like better control.  If LDL is not below 70 on his lab work, will increase Lipitor.

## 2023-04-11 NOTE — Assessment & Plan Note (Signed)
Has been at goal.  Repeating A1c.  I offered to increase his Mounjaro.  He declined.  Continue Mounjaro, metformin, and glipizide.  If his A1c remains well-controlled, will start decreasing glipizide.

## 2023-04-13 ENCOUNTER — Ambulatory Visit: Payer: Managed Care, Other (non HMO) | Admitting: Family Medicine

## 2023-04-17 LAB — CMP14+EGFR
AST: 26 IU/L (ref 0–40)
Albumin/Globulin Ratio: 1.2 (ref 1.2–2.2)
Albumin: 4.3 g/dL (ref 3.8–4.9)
BUN: 13 mg/dL (ref 6–24)
CO2: 24 mmol/L (ref 20–29)
Calcium: 9.8 mg/dL (ref 8.7–10.2)
Chloride: 101 mmol/L (ref 96–106)
eGFR: 96 mL/min/{1.73_m2} (ref >59)

## 2023-04-17 LAB — LIPID PANEL
Chol/HDL Ratio: 4.2 ratio (ref 0.0–5.0)
VLDL Cholesterol Cal: 24 mg/dL (ref 5–40)

## 2023-04-19 LAB — CMP14+EGFR
ALT: 29 IU/L (ref 0–44)
Alkaline Phosphatase: 102 IU/L (ref 44–121)
BUN/Creatinine Ratio: 14 (ref 9–20)
Bilirubin Total: 0.3 mg/dL (ref 0.0–1.2)
Creatinine, Ser: 0.94 mg/dL (ref 0.76–1.27)
Globulin, Total: 3.5 g/dL (ref 1.5–4.5)
Glucose: 123 mg/dL — ABNORMAL HIGH (ref 70–99)
Potassium: 4.7 mmol/L (ref 3.5–5.2)
Sodium: 145 mmol/L — ABNORMAL HIGH (ref 134–144)
Total Protein: 7.8 g/dL (ref 6.0–8.5)

## 2023-04-19 LAB — CBC
Hematocrit: 43.1 % (ref 37.5–51.0)
Hemoglobin: 13.7 g/dL (ref 13.0–17.7)
MCH: 23.9 pg — ABNORMAL LOW (ref 26.6–33.0)
MCHC: 31.8 g/dL (ref 31.5–35.7)
MCV: 75 fL — ABNORMAL LOW (ref 79–97)
Platelets: 319 10*3/uL (ref 150–450)
RBC: 5.73 x10E6/uL (ref 4.14–5.80)
RDW: 15.3 % (ref 11.6–15.4)
WBC: 10 10*3/uL (ref 3.4–10.8)

## 2023-04-19 LAB — LIPID PANEL
Cholesterol, Total: 146 mg/dL (ref 100–199)
HDL: 35 mg/dL — ABNORMAL LOW (ref >39)
LDL Chol Calc (NIH): 87 mg/dL (ref 0–99)
Triglycerides: 131 mg/dL (ref 0–149)

## 2023-04-19 LAB — HEMOGLOBIN A1C
Est. average glucose Bld gHb Est-mCnc: 154 mg/dL
Hgb A1c MFr Bld: 7 % — ABNORMAL HIGH (ref 4.8–5.6)

## 2023-04-19 LAB — MICROALBUMIN / CREATININE URINE RATIO
Creatinine, Urine: 322.8 mg/dL
Microalb/Creat Ratio: 16 mg/g creat (ref 0–29)
Microalbumin, Urine: 51.2 ug/mL

## 2023-05-07 ENCOUNTER — Encounter: Payer: Self-pay | Admitting: *Deleted

## 2023-06-11 ENCOUNTER — Other Ambulatory Visit: Payer: Self-pay | Admitting: Family Medicine

## 2023-06-11 DIAGNOSIS — E785 Hyperlipidemia, unspecified: Secondary | ICD-10-CM

## 2023-07-07 ENCOUNTER — Other Ambulatory Visit: Payer: Self-pay | Admitting: Family Medicine

## 2023-07-07 DIAGNOSIS — I10 Essential (primary) hypertension: Secondary | ICD-10-CM

## 2023-07-12 ENCOUNTER — Ambulatory Visit: Payer: Managed Care, Other (non HMO) | Admitting: Family Medicine

## 2023-07-12 VITALS — BP 134/84 | HR 90 | Temp 97.7°F | Wt 287.0 lb

## 2023-07-12 DIAGNOSIS — I10 Essential (primary) hypertension: Secondary | ICD-10-CM | POA: Diagnosis not present

## 2023-07-12 DIAGNOSIS — E785 Hyperlipidemia, unspecified: Secondary | ICD-10-CM

## 2023-07-12 DIAGNOSIS — E119 Type 2 diabetes mellitus without complications: Secondary | ICD-10-CM

## 2023-07-12 DIAGNOSIS — R718 Other abnormality of red blood cells: Secondary | ICD-10-CM

## 2023-07-12 MED ORDER — TIRZEPATIDE 15 MG/0.5ML ~~LOC~~ SOAJ: 15.0000 mg | SUBCUTANEOUS | 2 refills | Status: DC

## 2023-07-12 NOTE — Patient Instructions (Addendum)
Labs on/after 7/23.  Follow up in 3-6 months.  Take care  Dr. Adriana Simas

## 2023-07-13 NOTE — Assessment & Plan Note (Signed)
Stable.  Continue current medications.

## 2023-07-13 NOTE — Assessment & Plan Note (Signed)
Lipid panel ordered. If LDL remains above 70 will increase to Lipitor to 80 mg.

## 2023-07-13 NOTE — Progress Notes (Signed)
Subjective:  Patient ID: Caleb Ballard, male    DOB: 1966/03/17  Age: 57 y.o. MRN: [id number]  CC: Chief Complaint  Patient presents with   3 month follow up    Diabetes and weight loss , htn , labs from last labs    HPI:  57 year old male with the below mentioned medical problems presents for follow up.  Doing well. No chest pain or SOB.  HTN stable on Norvasc and Benazapril/hydrochlorothiazide.  Last A1C was at goal. He is a few days shy of getting A1C. Tolerating Mounjaro 12.5 mg. Also on Metformin and Glipizide.   Last LDL 87. Tolerating Lipitor. Needs lipid panel.   Patient Active Problem List   Diagnosis Date Noted   Obesity (BMI 30-39.9) 10/13/2022   Chronic low back pain with left-sided sciatica 10/13/2022   Diabetes (HCC) 04/13/2013   Hyperlipidemia 02/14/2007   Essential hypertension 02/14/2007   GERD 02/14/2007    Social Hx   Social History   Socioeconomic History   Marital status: Single    Spouse name: Not on file   Number of children: Not on file   Years of education: Not on file   Highest education level: Not on file  Occupational History   Not on file  Tobacco Use   Smoking status: Never   Smokeless tobacco: Never  Vaping Use   Vaping status: Never Used  Substance and Sexual Activity   Alcohol use: Not Currently   Drug use: Not Currently   Sexual activity: Not on file  Other Topics Concern   Not on file  Social History Narrative   Not on file   Social Determinants of Health   Financial Resource Strain: Not on file  Food Insecurity: Not on file  Transportation Needs: Not on file  Physical Activity: Not on file  Stress: Not on file  Social Connections: Not on file    Review of Systems Per HPI  Objective:  BP 134/84   Pulse 90   Temp 97.7 F (36.5 C)   Wt 287 lb (130.2 kg)   SpO2 98%   BMI 38.92 kg/m      07/12/2023    3:21 PM 04/10/2023    3:31 PM 04/10/2023    2:46 PM  BP/Weight  Systolic BP 134 158 144   Diastolic BP 84 88 90  Wt. (Lbs) 287  289  BMI 38.92 kg/m2  39.2 kg/m2    Physical Exam Vitals and nursing note reviewed.  Constitutional:      General: He is not in acute distress.    Appearance: Normal appearance.  HENT:     Head: Normocephalic and atraumatic.  Eyes:     General:        Right eye: No discharge.        Left eye: No discharge.     Conjunctiva/sclera: Conjunctivae normal.  Cardiovascular:     Rate and Rhythm: Normal rate and regular rhythm.  Pulmonary:     Effort: Pulmonary effort is normal.     Breath sounds: Normal breath sounds. No wheezing, rhonchi or rales.  Neurological:     Mental Status: He is alert.  Psychiatric:        Mood and Affect: Mood normal.        Behavior: Behavior normal.     Lab Results  Component Value Date   WBC 10.0 04/16/2023   HGB 13.7 04/16/2023   HCT 43.1 04/16/2023   PLT 319 04/16/2023   GLUCOSE  123 (H) 04/16/2023   CHOL 146 04/16/2023   TRIG 131 04/16/2023   HDL 35 (L) 04/16/2023   LDLCALC 87 04/16/2023   ALT 29 04/16/2023   AST 26 04/16/2023   NA 145 (H) 04/16/2023   K 4.7 04/16/2023   CL 101 04/16/2023   CREATININE 0.94 04/16/2023   BUN 13 04/16/2023   CO2 24 04/16/2023   TSH 2.561 08/07/2009   PSA 1.2 02/10/2015   HGBA1C 7.0 (H) 04/16/2023     Assessment & Plan:   Problem List Items Addressed This Visit       Cardiovascular and Mediastinum   Essential hypertension - Primary    Stable. Continue current medications.        Endocrine   Diabetes (HCC)    Future A1C ordered. Mounjaro increased (to help with weight loss as well).       Relevant Medications   tirzepatide (MOUNJARO) 15 MG/0.5ML Pen   Other Relevant Orders   CMP14+EGFR   Hemoglobin A1c     Other   Hyperlipidemia    Lipid panel ordered. If LDL remains above 70 will increase to Lipitor to 80 mg.       Relevant Orders   Lipid panel   Other Visit Diagnoses     Microcytosis       Relevant Orders   CBC   Iron, TIBC and  Ferritin Panel       Meds ordered this encounter  Medications   tirzepatide (MOUNJARO) 15 MG/0.5ML Pen    Sig: Inject 15 mg into the skin once a week.    Dispense:  6 mL    Refill:  2    Follow-up:  3-6 months  Gideon Burstein Adriana Simas DO [facility]

## 2023-07-13 NOTE — Assessment & Plan Note (Signed)
Future A1C ordered. Mounjaro increased (to help with weight loss as well).

## 2023-07-19 LAB — CBC
Hematocrit: 40.6 % (ref 37.5–51.0)
Hemoglobin: 12.9 g/dL — ABNORMAL LOW (ref 13.0–17.7)
MCH: 23.9 pg — ABNORMAL LOW (ref 26.6–33.0)
MCV: 75 fL — ABNORMAL LOW (ref 79–97)
Platelets: 336 10*3/uL (ref 150–450)
RDW: 15.6 % — ABNORMAL HIGH (ref 11.6–15.4)
WBC: 10.3 10*3/uL (ref 3.4–10.8)

## 2023-07-19 LAB — CMP14+EGFR
ALT: 24 IU/L (ref 0–44)
Albumin: 4.2 g/dL (ref 3.8–4.9)
Alkaline Phosphatase: 96 IU/L (ref 44–121)
BUN: 10 mg/dL (ref 6–24)
Bilirubin Total: <0.2 mg/dL (ref 0.0–1.2)
CO2: 24 mmol/L (ref 20–29)
Calcium: 9.4 mg/dL (ref 8.7–10.2)
Chloride: 102 mmol/L (ref 96–106)
Creatinine, Ser: 0.91 mg/dL (ref 0.76–1.27)
Globulin, Total: 3.3 g/dL (ref 1.5–4.5)
Glucose: 121 mg/dL — ABNORMAL HIGH (ref 70–99)
Sodium: 142 mmol/L (ref 134–144)
Total Protein: 7.5 g/dL (ref 6.0–8.5)
eGFR: 100 mL/min/{1.73_m2} (ref >59)

## 2023-07-19 LAB — IRON,TIBC AND FERRITIN PANEL
Iron: 39 ug/dL (ref 38–169)
Total Iron Binding Capacity: 283 ug/dL (ref 250–450)
UIBC: 244 ug/dL (ref 111–343)

## 2023-07-19 LAB — LIPID PANEL
Chol/HDL Ratio: 3.9 ratio (ref 0.0–5.0)
Cholesterol, Total: 118 mg/dL (ref 100–199)
HDL: 30 mg/dL — ABNORMAL LOW (ref >39)
Triglycerides: 323 mg/dL — ABNORMAL HIGH (ref 0–149)

## 2023-07-19 LAB — HEMOGLOBIN A1C
Est. average glucose Bld gHb Est-mCnc: 160 mg/dL
Hgb A1c MFr Bld: 7.2 % — ABNORMAL HIGH (ref 4.8–5.6)

## 2023-10-18 ENCOUNTER — Other Ambulatory Visit: Payer: Self-pay | Admitting: Family Medicine

## 2023-12-15 ENCOUNTER — Other Ambulatory Visit: Payer: Self-pay | Admitting: Family Medicine

## 2023-12-15 DIAGNOSIS — E785 Hyperlipidemia, unspecified: Secondary | ICD-10-CM

## 2023-12-27 ENCOUNTER — Other Ambulatory Visit: Payer: Self-pay | Admitting: Family Medicine

## 2023-12-27 DIAGNOSIS — E119 Type 2 diabetes mellitus without complications: Secondary | ICD-10-CM

## 2023-12-28 ENCOUNTER — Other Ambulatory Visit: Payer: Self-pay

## 2023-12-28 DIAGNOSIS — E119 Type 2 diabetes mellitus without complications: Secondary | ICD-10-CM

## 2023-12-28 MED ORDER — METFORMIN HCL 500 MG PO TABS
ORAL_TABLET | ORAL | 1 refills | Status: DC
Start: 2023-12-28 — End: 2024-01-15

## 2024-01-14 ENCOUNTER — Ambulatory Visit: Payer: Managed Care, Other (non HMO) | Admitting: Family Medicine

## 2024-01-14 VITALS — BP 150/82 | HR 82 | Temp 97.6°F | Ht 72.0 in | Wt 286.6 lb

## 2024-01-14 DIAGNOSIS — R718 Other abnormality of red blood cells: Secondary | ICD-10-CM

## 2024-01-14 DIAGNOSIS — Z125 Encounter for screening for malignant neoplasm of prostate: Secondary | ICD-10-CM

## 2024-01-14 DIAGNOSIS — E785 Hyperlipidemia, unspecified: Secondary | ICD-10-CM

## 2024-01-14 DIAGNOSIS — E119 Type 2 diabetes mellitus without complications: Secondary | ICD-10-CM

## 2024-01-14 DIAGNOSIS — E1169 Type 2 diabetes mellitus with other specified complication: Secondary | ICD-10-CM | POA: Diagnosis not present

## 2024-01-14 DIAGNOSIS — Z23 Encounter for immunization: Secondary | ICD-10-CM | POA: Diagnosis not present

## 2024-01-14 DIAGNOSIS — I10 Essential (primary) hypertension: Secondary | ICD-10-CM | POA: Diagnosis not present

## 2024-01-14 NOTE — Patient Instructions (Signed)
Continue your medications.  Follow up in 6 months.  Take care  Dr. Avelino Herren  

## 2024-01-15 LAB — LIPID PANEL
Chol/HDL Ratio: 4.2 {ratio} (ref 0.0–5.0)
Cholesterol, Total: 130 mg/dL (ref 100–199)
HDL: 31 mg/dL — ABNORMAL LOW (ref >39)
LDL Chol Calc (NIH): 68 mg/dL (ref 0–99)
Triglycerides: 185 mg/dL — ABNORMAL HIGH (ref 0–149)
VLDL Cholesterol Cal: 31 mg/dL (ref 5–40)

## 2024-01-15 LAB — CMP14+EGFR
ALT: 23 [IU]/L (ref 0–44)
AST: 21 [IU]/L (ref 0–40)
Albumin: 4.5 g/dL (ref 3.8–4.9)
Alkaline Phosphatase: 107 [IU]/L (ref 44–121)
BUN/Creatinine Ratio: 9 (ref 9–20)
BUN: 9 mg/dL (ref 6–24)
Bilirubin Total: 0.3 mg/dL (ref 0.0–1.2)
CO2: 25 mmol/L (ref 20–29)
Calcium: 9.8 mg/dL (ref 8.7–10.2)
Chloride: 100 mmol/L (ref 96–106)
Creatinine, Ser: 1 mg/dL (ref 0.76–1.27)
Globulin, Total: 3.2 g/dL (ref 1.5–4.5)
Glucose: 113 mg/dL — ABNORMAL HIGH (ref 70–99)
Potassium: 4.2 mmol/L (ref 3.5–5.2)
Sodium: 143 mmol/L (ref 134–144)
Total Protein: 7.7 g/dL (ref 6.0–8.5)
eGFR: 88 mL/min/{1.73_m2} (ref >59)

## 2024-01-15 LAB — MICROALBUMIN / CREATININE URINE RATIO
Creatinine, Urine: 150.1 mg/dL
Microalb/Creat Ratio: 8 mg/g{creat} (ref 0–29)
Microalbumin, Urine: 12.3 ug/mL

## 2024-01-15 LAB — HEMOGLOBIN A1C
Est. average glucose Bld gHb Est-mCnc: 171 mg/dL
Hgb A1c MFr Bld: 7.6 % — ABNORMAL HIGH (ref 4.8–5.6)

## 2024-01-15 LAB — CBC
Hematocrit: 44.3 % (ref 37.5–51.0)
Hemoglobin: 13.9 g/dL (ref 13.0–17.7)
MCH: 24.3 pg — ABNORMAL LOW (ref 26.6–33.0)
MCHC: 31.4 g/dL — ABNORMAL LOW (ref 31.5–35.7)
MCV: 78 fL — ABNORMAL LOW (ref 79–97)
Platelets: 350 10*3/uL (ref 150–450)
RBC: 5.71 x10E6/uL (ref 4.14–5.80)
RDW: 14.7 % (ref 11.6–15.4)
WBC: 10.9 10*3/uL — ABNORMAL HIGH (ref 3.4–10.8)

## 2024-01-15 LAB — IRON,TIBC AND FERRITIN PANEL
Ferritin: 154 ng/mL (ref 30–400)
Iron Saturation: 20 % (ref 15–55)
Iron: 58 ug/dL (ref 38–169)
Total Iron Binding Capacity: 296 ug/dL (ref 250–450)
UIBC: 238 ug/dL (ref 111–343)

## 2024-01-15 LAB — PSA: Prostate Specific Ag, Serum: 2.2 ng/mL (ref 0.0–4.0)

## 2024-01-15 MED ORDER — TIRZEPATIDE 15 MG/0.5ML ~~LOC~~ SOAJ: 15.0000 mg | SUBCUTANEOUS | 2 refills | Status: DC

## 2024-01-15 MED ORDER — ATORVASTATIN CALCIUM 40 MG PO TABS: 40.0000 mg | ORAL_TABLET | Freq: Every day | ORAL | 3 refills | Status: DC

## 2024-01-15 MED ORDER — METFORMIN HCL 500 MG PO TABS: ORAL_TABLET | ORAL | 3 refills | Status: DC

## 2024-01-15 NOTE — Assessment & Plan Note (Signed)
A1c today.  Continue Mounjaro, glipizide, and metformin.  Would benefit from weight loss.

## 2024-01-15 NOTE — Progress Notes (Signed)
Subjective:  Patient ID: Caleb Ballard, male    DOB: 04-17-1966  Age: 58 y.o. MRN: [id number]  CC: Follow-up   HPI:  58 year old male with hypertension, GERD, type 2 diabetes, obesity, hyperlipidemia presents for follow-up.  BP mildly elevated here today.  He is compliant with his medication, benazepril/HCTZ, and amlodipine.  Will recheck later in the visit.  Patient needs A1c today.  Last A1c 7.2.  He is on a Mounjaro 50 mg, metformin total daily amount 2000 mg, and glipizide 10 mg twice daily.  No reports of hypoglycemia.  LDL has been at goal on atorvastatin.  Needs lipid panel today.  Patient reports overall he is feeling well.  He does note that he has some ongoing stress due to his job.  Patient Active Problem List   Diagnosis Date Noted   Obesity (BMI 30-39.9) 10/13/2022   Chronic low back pain with left-sided sciatica 10/13/2022   Diabetes (HCC) 04/13/2013   Hyperlipidemia 02/14/2007   Essential hypertension 02/14/2007   GERD 02/14/2007    Social Hx   Social History   Socioeconomic History   Marital status: Single    Spouse name: Not on file   Number of children: Not on file   Years of education: Not on file   Highest education level: Not on file  Occupational History   Not on file  Tobacco Use   Smoking status: Never   Smokeless tobacco: Never  Vaping Use   Vaping status: Never Used  Substance and Sexual Activity   Alcohol use: Not Currently   Drug use: Not Currently   Sexual activity: Not on file  Other Topics Concern   Not on file  Social History Narrative   Not on file   Social Drivers of Health   Financial Resource Strain: Not on file  Food Insecurity: Not on file  Transportation Needs: Not on file  Physical Activity: Not on file  Stress: Not on file  Social Connections: Not on file    Review of Systems Per HPI  Objective:  BP (!) 150/82   Pulse 82   Temp 97.6 F (36.4 C)   Ht 6' (1.829 m)   Wt 286 lb 9.6 oz (130 kg)   SpO2  98%   BMI 38.87 kg/m      01/14/2024    3:16 PM 01/14/2024    2:42 PM 07/12/2023    3:21 PM  BP/Weight  Systolic BP 150 152 134  Diastolic BP 82 83 84  Wt. (Lbs)  286.6 287  BMI  38.87 kg/m2 38.92 kg/m2    Physical Exam Vitals and nursing note reviewed.  Constitutional:      General: He is not in acute distress.    Appearance: Normal appearance.  HENT:     Head: Normocephalic and atraumatic.  Eyes:     General:        Right eye: No discharge.        Left eye: No discharge.     Conjunctiva/sclera: Conjunctivae normal.  Cardiovascular:     Rate and Rhythm: Normal rate and regular rhythm.  Pulmonary:     Effort: Pulmonary effort is normal.     Breath sounds: Normal breath sounds. No wheezing, rhonchi or rales.  Feet:     Comments: Diabetic Foot Check -  Appearance - no lesions, ulcers or calluses Skin - no unusual pallor or redness Monofilament testing -  Right - Great toe, medial, central, lateral ball and posterior foot intact Left -  Great toe, medial, central, lateral ball and posterior foot intact  Neurological:     Mental Status: He is alert.  Psychiatric:        Mood and Affect: Mood normal.        Behavior: Behavior normal.     Lab Results  Component Value Date   WBC 10.9 (H) 01/14/2024   HGB 13.9 01/14/2024   HCT 44.3 01/14/2024   PLT 350 01/14/2024   GLUCOSE 113 (H) 01/14/2024   CHOL 130 01/14/2024   TRIG 185 (H) 01/14/2024   HDL 31 (L) 01/14/2024   LDLCALC 68 01/14/2024   ALT 23 01/14/2024   AST 21 01/14/2024   NA 143 01/14/2024   K 4.2 01/14/2024   CL 100 01/14/2024   CREATININE 1.00 01/14/2024   BUN 9 01/14/2024   CO2 25 01/14/2024   TSH 2.561 08/07/2009   PSA 1.2 02/10/2015   HGBA1C 7.6 (H) 01/14/2024     Assessment & Plan:   Problem List Items Addressed This Visit       Cardiovascular and Mediastinum   Essential hypertension   BP mildly elevated today, rechecked and was still elevated.  Will continue to monitor closely.  No  medication changes today.      Relevant Medications   atorvastatin (LIPITOR) 40 MG tablet     Endocrine   Diabetes (HCC) - Primary   A1c today.  Continue Mounjaro, glipizide, and metformin.  Would benefit from weight loss.      Relevant Medications   atorvastatin (LIPITOR) 40 MG tablet   tirzepatide (MOUNJARO) 15 MG/0.5ML Pen   metFORMIN (GLUCOPHAGE) 500 MG tablet   Other Relevant Orders   CMP14+EGFR (Completed)   Hemoglobin A1c (Completed)     Other   Hyperlipidemia   Lipid panel today.  Continue statin.      Relevant Medications   atorvastatin (LIPITOR) 40 MG tablet   Other Relevant Orders   Lipid panel (Completed)   Microalbumin / creatinine urine ratio (Completed)   Other Visit Diagnoses       Immunization due       Relevant Orders   Flu vaccine trivalent PF, 6mos and older(Flulaval,Afluria,Fluarix,Fluzone) (Completed)   Pneumococcal conjugate vaccine 20-valent (Prevnar 20) (Completed)     Microcytosis       Relevant Orders   CBC (Completed)   Iron, TIBC and Ferritin Panel (Completed)     Screening PSA (prostate specific antigen)       Relevant Orders   PSA (Completed)       Meds ordered this encounter  Medications   atorvastatin (LIPITOR) 40 MG tablet    Sig: Take 1 tablet (40 mg total) by mouth daily.    Dispense:  90 tablet    Refill:  3   tirzepatide (MOUNJARO) 15 MG/0.5ML Pen    Sig: Inject 15 mg into the skin once a week.    Dispense:  6 mL    Refill:  2   metFORMIN (GLUCOPHAGE) 500 MG tablet    Sig: TAKE 2 TABLETS BY MOUTH TWICE DAILY WITH FOOD    Dispense:  360 tablet    Refill:  3    Follow-up:  6 months  Klaus Casteneda Adriana Simas DO [facility]

## 2024-01-15 NOTE — Assessment & Plan Note (Signed)
BP mildly elevated today, rechecked and was still elevated.  Will continue to monitor closely.  No medication changes today.

## 2024-01-15 NOTE — Assessment & Plan Note (Signed)
Lipid panel today.  Continue statin. 

## 2024-01-16 ENCOUNTER — Encounter: Payer: Self-pay | Admitting: Family Medicine

## 2024-01-17 ENCOUNTER — Telehealth: Payer: Self-pay | Admitting: Pharmacist

## 2024-01-17 NOTE — Telephone Encounter (Signed)
Submitted PA on covermymeds and got this response:

## 2024-02-25 ENCOUNTER — Telehealth: Payer: Self-pay

## 2024-02-25 NOTE — Telephone Encounter (Signed)
 Patient dropped off document  Medical Clearance  , to be filled out by provider. Patient requested to send it back via Fax within 7-days. Document is located in providers tray at front office.Please advise at Mobile [phone] (mobile)

## 2024-02-28 ENCOUNTER — Other Ambulatory Visit: Payer: Self-pay | Admitting: Family Medicine

## 2024-04-18 ENCOUNTER — Ambulatory Visit: Payer: Self-pay | Admitting: Family Medicine

## 2024-04-18 ENCOUNTER — Encounter: Payer: Self-pay | Admitting: Family Medicine

## 2024-04-18 VITALS — Ht 72.0 in

## 2024-04-18 DIAGNOSIS — L03011 Cellulitis of right finger: Secondary | ICD-10-CM

## 2024-04-20 DIAGNOSIS — L03019 Cellulitis of unspecified finger: Secondary | ICD-10-CM | POA: Insufficient documentation

## 2024-04-20 NOTE — Progress Notes (Signed)
 Subjective:  Patient ID: Caleb Ballard, male    DOB: [birth year]  Age: 58 y.o. MRN: [id number]  CC:   Chief Complaint  Patient presents with   pain in right had middle finger    1 week     HPI:  58 year old male presents for evaluation of the above.   1 week history of swelling around the nail of the right middle finger. No significant pain at this time. Unclear inciting factor. Attempted to get it to drain after using a lancet. No fever. No other complaints.   Patient Active Problem List   Diagnosis Date Noted   Paronychia of finger 04/20/2024   Obesity (BMI 30-39.9) 10/13/2022   Chronic low back pain with left-sided sciatica 10/13/2022   Diabetes (HCC) 04/13/2013   Hyperlipidemia 02/14/2007   Essential hypertension 02/14/2007   GERD 02/14/2007    Social Hx   Social History   Socioeconomic History   Marital status: Single    Spouse name: Not on file   Number of children: Not on file   Years of education: Not on file   Highest education level: Not on file  Occupational History   Not on file  Tobacco Use   Smoking status: Never   Smokeless tobacco: Never  Vaping Use   Vaping status: Never Used  Substance and Sexual Activity   Alcohol  use: Not Currently   Drug use: Not Currently   Sexual activity: Not on file  Other Topics Concern   Not on file  Social History Narrative   Not on file   Social Drivers of Health   Financial Resource Strain: Not on file  Food Insecurity: Not on file  Transportation Needs: Not on file  Physical Activity: Not on file  Stress: Not on file  Social Connections: Not on file    Review of Systems Per HPI  Objective:  Ht 6' (1.829 m)   BMI 38.87 kg/m      01/14/2024    3:16 PM 01/14/2024    2:42 PM 07/12/2023    3:21 PM  BP/Weight  Systolic BP 150 152 134  Diastolic BP 82 83 84  Wt. (Lbs)  286.6 287  BMI  38.87 kg/m2 38.92 kg/m2    Physical Exam Vitals and nursing note reviewed.  Constitutional:      General:  He is not in acute distress.    Appearance: Normal appearance.  Skin:    Comments: Right middle finger - paronychia noted. No tenderness to palpation.  Neurological:     Mental Status: He is alert.     Lab Results  Component Value Date   WBC 10.9 (H) 01/14/2024   HGB 13.9 01/14/2024   HCT 44.3 01/14/2024   PLT 350 01/14/2024   GLUCOSE 113 (H) 01/14/2024   CHOL 130 01/14/2024   TRIG 185 (H) 01/14/2024   HDL 31 (L) 01/14/2024   LDLCALC 68 01/14/2024   ALT 23 01/14/2024   AST 21 01/14/2024   NA 143 01/14/2024   K 4.2 01/14/2024   CL 100 01/14/2024   CREATININE 1.00 01/14/2024   BUN 9 01/14/2024   CO2 25 01/14/2024   TSH 2.561 08/07/2009   PSA 1.2 02/10/2015   HGBA1C 7.6 (H) 01/14/2024   I & D  Date/Time: 04/20/2024 8:30 PM  Performed by: Malyk Girouard G, DO Authorized by: Titania Gault G, DO   Consent:    Consent obtained:  Verbal Location:    Type:  Abscess   Location:  Upper extremity   Upper extremity location:  Finger   Finger location:  R long finger Pre-procedure details:    Skin preparation:  Alcohol  Sedation:    Sedation type:  None Anesthesia:    Anesthesia method: Cold spray (PainEase) Procedure type:    Complexity:  Simple Procedure details:    Needle aspiration: no     Incision types:  Stab incision   Scalpel blade:  11   Drainage:  Purulent   Drainage amount:  Moderate   Wound treatment:  Wound left open   Packing materials:  None Post-procedure details:    Procedure completion:  Tolerated well, no immediate complications    Assessment & Plan:  Paronychia of finger of right hand Assessment & Plan: Incision and drainage performed today. Supportive care.  Orders: -     I & D    Follow-up:  Return if symptoms worsen or fail to improve.  Kathleen Papa DO [facility]

## 2024-04-20 NOTE — Assessment & Plan Note (Signed)
 Incision and drainage performed today. Supportive care.

## 2024-04-24 ENCOUNTER — Telehealth: Payer: Self-pay | Admitting: Pharmacy Technician

## 2024-04-24 ENCOUNTER — Other Ambulatory Visit (HOSPITAL_COMMUNITY): Payer: Self-pay

## 2024-04-24 NOTE — Telephone Encounter (Signed)
 Pharmacy Patient Advocate Encounter   Received notification from CoverMyMeds that prior authorization for Mounjaro  15MG /0.5ML auto-injectors is required/requested.   Insurance verification completed.   The patient is insured through [facility] .   Per test claim: PA required; PA submitted to above mentioned insurance via CoverMyMeds Key/confirmation #/[facility] Status is pending

## 2024-04-25 ENCOUNTER — Other Ambulatory Visit (HOSPITAL_COMMUNITY): Payer: Self-pay

## 2024-04-25 NOTE — Telephone Encounter (Signed)
 Pharmacy Patient Advocate Encounter  Received notification from [employer] that Prior Authorization for Mounjaro  15MG /0.5ML auto-injectors has been APPROVED from 04/24/2024 to 04/24/2025. Unable to obtain price due to refill too soon rejection, last fill date 04/25/2024 next available fill date05/23/2025.   PA #/Case ID/Reference #: [id number]

## 2024-04-26 ENCOUNTER — Other Ambulatory Visit: Payer: Self-pay | Admitting: Family Medicine

## 2024-04-26 DIAGNOSIS — I10 Essential (primary) hypertension: Secondary | ICD-10-CM

## 2024-04-28 ENCOUNTER — Other Ambulatory Visit: Payer: Self-pay

## 2024-04-28 DIAGNOSIS — I10 Essential (primary) hypertension: Secondary | ICD-10-CM

## 2024-04-28 MED ORDER — AMLODIPINE BESYLATE 10 MG PO TABS: 10.0000 mg | ORAL_TABLET | Freq: Every day | ORAL | 2 refills | Status: DC

## 2024-04-28 MED ORDER — BENAZEPRIL-HYDROCHLOROTHIAZIDE 20-25 MG PO TABS: 1.0000 | ORAL_TABLET | Freq: Every day | ORAL | 2 refills | Status: DC

## 2024-05-23 ENCOUNTER — Other Ambulatory Visit: Payer: Self-pay | Admitting: Family Medicine

## 2024-05-23 DIAGNOSIS — E119 Type 2 diabetes mellitus without complications: Secondary | ICD-10-CM

## 2024-06-18 ENCOUNTER — Telehealth: Payer: Self-pay | Admitting: Pharmacy Technician

## 2024-06-18 ENCOUNTER — Other Ambulatory Visit (HOSPITAL_COMMUNITY): Payer: Self-pay

## 2024-06-18 NOTE — Telephone Encounter (Signed)
 Pharmacy Patient Advocate Encounter   Received notification from CoverMyMeds that prior authorization for Mounjaro  15MG /0.5ML auto-injectors is required/requested.   Insurance verification completed.   The patient is insured through [facility] .   Per test claim: PA required; PA submitted to above mentioned insurance via CoverMyMeds Key/confirmation #/[id number] Status is pending  Pharmacy Patient Advocate Encounter  Received notification from [facility] that Prior Authorization for Mounjaro  15MG /0.5ML auto-injectors has been CANCELLED due to   PA #/Case ID/Reference #: [id number]

## 2024-07-01 ENCOUNTER — Other Ambulatory Visit (HOSPITAL_COMMUNITY): Payer: Self-pay

## 2024-07-02 ENCOUNTER — Other Ambulatory Visit (HOSPITAL_COMMUNITY): Payer: Self-pay

## 2024-07-14 ENCOUNTER — Ambulatory Visit: Payer: Managed Care, Other (non HMO) | Admitting: Family Medicine

## 2024-07-24 ENCOUNTER — Ambulatory Visit: Admitting: Family Medicine

## 2024-07-24 VITALS — BP 118/74 | HR 89 | Temp 98.2°F | Ht 72.0 in | Wt 284.2 lb

## 2024-07-24 DIAGNOSIS — E785 Hyperlipidemia, unspecified: Secondary | ICD-10-CM | POA: Diagnosis not present

## 2024-07-24 DIAGNOSIS — E119 Type 2 diabetes mellitus without complications: Secondary | ICD-10-CM | POA: Diagnosis not present

## 2024-07-24 DIAGNOSIS — I10 Essential (primary) hypertension: Secondary | ICD-10-CM

## 2024-07-24 MED ORDER — TIRZEPATIDE 15 MG/0.5ML ~~LOC~~ SOAJ: 15.0000 mg | SUBCUTANEOUS | 2 refills | Status: DC

## 2024-07-24 MED ORDER — GLIPIZIDE 5 MG PO TABS: 10.0000 mg | ORAL_TABLET | Freq: Two times a day (BID) | ORAL | 3 refills | Status: DC

## 2024-07-24 MED ORDER — METFORMIN HCL 500 MG PO TABS: ORAL_TABLET | ORAL | 3 refills | Status: DC

## 2024-07-24 NOTE — Assessment & Plan Note (Signed)
 Stable.  Continue current medications.

## 2024-07-24 NOTE — Assessment & Plan Note (Signed)
 Last A1c uncontrolled. A1c today to assess. Continue Mounjaro  and Glipizide .

## 2024-07-24 NOTE — Patient Instructions (Signed)
 A1c today.  Follow up in 6 months.

## 2024-07-24 NOTE — Progress Notes (Signed)
 Subjective:  Patient ID: Caleb Ballard, male    DOB: [birth year]  Age: 58 y.o. MRN: [id number]  CC:  Follow up   HPI:  58 year old male with HTN, GERD, DM-2, HLD presents for follow up.  BP well controlled on Norvasc  and Benazepril /hydrochlorothiazide .   LDL at goal on Lipitor. Reports occasional cramping in the legs.   Last A1c was not at goal (7.6). Tolerating Metformin , Mounjaro , and Glipizide . No reported side effects.  Needs A1c today.  Patient Active Problem List   Diagnosis Date Noted   Obesity (BMI 30-39.9) 10/13/2022   Chronic low back pain with left-sided sciatica 10/13/2022   Diabetes (HCC) 04/13/2013   Hyperlipidemia 02/14/2007   Essential hypertension 02/14/2007   GERD 02/14/2007    Social Hx   Social History   Socioeconomic History   Marital status: Single    Spouse name: Not on file   Number of children: Not on file   Years of education: Not on file   Highest education level: Not on file  Occupational History   Not on file  Tobacco Use   Smoking status: Never   Smokeless tobacco: Never  Vaping Use   Vaping status: Never Used  Substance and Sexual Activity   Alcohol  use: Not Currently   Drug use: Not Currently   Sexual activity: Not on file  Other Topics Concern   Not on file  Social History Narrative   Not on file   Social Drivers of Health   Financial Resource Strain: Not on file  Food Insecurity: Not on file  Transportation Needs: Not on file  Physical Activity: Not on file  Stress: Not on file  Social Connections: Not on file    Review of Systems Per HPI  Objective:  BP 118/74   Pulse 89   Temp 98.2 F (36.8 C)   Ht 6' (1.829 m)   Wt 284 lb 3.2 oz (128.9 kg)   SpO2 100%   BMI 38.54 kg/m      07/24/2024    9:39 AM 07/24/2024    9:34 AM 01/14/2024    3:16 PM  BP/Weight  Systolic BP 118 146 150  Diastolic BP 74 78 82  Wt. (Lbs)  284.2   BMI  38.54 kg/m2     Physical Exam Constitutional:      Appearance: Normal  appearance. He is obese.  HENT:     Head: Normocephalic and atraumatic.  Cardiovascular:     Rate and Rhythm: Normal rate and regular rhythm.  Pulmonary:     Effort: Pulmonary effort is normal.     Breath sounds: Normal breath sounds. No wheezing, rhonchi or rales.  Neurological:     Mental Status: He is alert.  Psychiatric:        Mood and Affect: Mood normal.        Behavior: Behavior normal.     Lab Results  Component Value Date   WBC 10.9 (H) 01/14/2024   HGB 13.9 01/14/2024   HCT 44.3 01/14/2024   PLT 350 01/14/2024   GLUCOSE 113 (H) 01/14/2024   CHOL 130 01/14/2024   TRIG 185 (H) 01/14/2024   HDL 31 (L) 01/14/2024   LDLCALC 68 01/14/2024   ALT 23 01/14/2024   AST 21 01/14/2024   NA 143 01/14/2024   K 4.2 01/14/2024   CL 100 01/14/2024   CREATININE 1.00 01/14/2024   BUN 9 01/14/2024   CO2 25 01/14/2024   TSH 2.561 08/07/2009   PSA  1.2 02/10/2015   HGBA1C 7.6 (H) 01/14/2024     Assessment & Plan:  Type 2 diabetes mellitus without complication, without long-term current use of insulin (HCC) Assessment & Plan: Last A1c uncontrolled. A1c today to assess. Continue Mounjaro  and Glipizide .  Orders: -     Hemoglobin A1c -     glipiZIDE ; Take 2 tablets (10 mg total) by mouth 2 (two) times daily before a meal. TAKE 2 TABLETS(10 MG) BY MOUTH TWICE DAILY BEFORE A MEAL  Dispense: 360 tablet; Refill: 3 -     metFORMIN  HCl; TAKE 2 TABLETS BY MOUTH TWICE DAILY WITH FOOD  Dispense: 360 tablet; Refill: 3 -     Tirzepatide ; Inject 15 mg into the skin once a week.  Dispense: 6 mL; Refill: 2  Essential hypertension Assessment & Plan: Stable. Continue current medications.   Hyperlipidemia, unspecified hyperlipidemia type Assessment & Plan: Lipids at goal on Lipitor. Continue.    Follow-up:  6 months  Zuzanna Maroney Bluford DO [facility]

## 2024-07-24 NOTE — Assessment & Plan Note (Signed)
Lipids at goal on Lipitor.  Continue.

## 2024-07-25 LAB — HEMOGLOBIN A1C
Est. average glucose Bld gHb Est-mCnc: 151 mg/dL
Hgb A1c MFr Bld: 6.9 % — ABNORMAL HIGH (ref 4.8–5.6)

## 2024-07-26 ENCOUNTER — Ambulatory Visit: Payer: Self-pay | Admitting: Family Medicine

## 2024-07-29 ENCOUNTER — Other Ambulatory Visit: Payer: Self-pay | Admitting: Family Medicine

## 2024-07-29 DIAGNOSIS — I10 Essential (primary) hypertension: Secondary | ICD-10-CM

## 2025-01-26 ENCOUNTER — Ambulatory Visit: Admitting: Family Medicine

## 2025-01-28 ENCOUNTER — Ambulatory Visit: Admitting: Family Medicine

## 2025-01-28 VITALS — BP 132/88 | HR 82 | Temp 97.8°F | Ht 72.0 in | Wt 276.4 lb

## 2025-01-28 DIAGNOSIS — E785 Hyperlipidemia, unspecified: Secondary | ICD-10-CM

## 2025-01-28 DIAGNOSIS — Z7984 Long term (current) use of oral hypoglycemic drugs: Secondary | ICD-10-CM | POA: Diagnosis not present

## 2025-01-28 DIAGNOSIS — Z125 Encounter for screening for malignant neoplasm of prostate: Secondary | ICD-10-CM

## 2025-01-28 DIAGNOSIS — R718 Other abnormality of red blood cells: Secondary | ICD-10-CM | POA: Diagnosis not present

## 2025-01-28 DIAGNOSIS — I10 Essential (primary) hypertension: Secondary | ICD-10-CM

## 2025-01-28 DIAGNOSIS — E119 Type 2 diabetes mellitus without complications: Secondary | ICD-10-CM | POA: Diagnosis not present

## 2025-01-28 MED ORDER — AMLODIPINE BESYLATE 10 MG PO TABS: 10.0000 mg | ORAL_TABLET | Freq: Every day | ORAL | 3 refills | Status: AC

## 2025-01-28 MED ORDER — BENAZEPRIL-HYDROCHLOROTHIAZIDE 20-25 MG PO TABS: 1.0000 | ORAL_TABLET | Freq: Every day | ORAL | 3 refills | Status: AC

## 2025-01-28 MED ORDER — METFORMIN HCL 500 MG PO TABS: ORAL_TABLET | ORAL | 3 refills | Status: AC

## 2025-01-28 MED ORDER — GLIPIZIDE 5 MG PO TABS: 10.0000 mg | ORAL_TABLET | Freq: Two times a day (BID) | ORAL | 3 refills | Status: AC

## 2025-01-28 MED ORDER — ATORVASTATIN CALCIUM 40 MG PO TABS: 40.0000 mg | ORAL_TABLET | Freq: Every day | ORAL | 3 refills | Status: AC

## 2025-01-28 MED ORDER — TIRZEPATIDE 15 MG/0.5ML ~~LOC~~ SOAJ: 15.0000 mg | SUBCUTANEOUS | 3 refills | Status: AC

## 2025-01-28 NOTE — Assessment & Plan Note (Signed)
 LDL at goal. Continue atorvastatin.

## 2025-01-28 NOTE — Assessment & Plan Note (Signed)
 Stable.  Medications refilled.  Labs ordered.

## 2025-01-28 NOTE — Assessment & Plan Note (Signed)
 Stable.  Medications refilled.  A1c ordered.  Diabetic foot exam performed today.

## 2025-01-28 NOTE — Patient Instructions (Signed)
Labs today.  Follow up in 6 months.  Take care  Dr. Alyxis Grippi  

## 2025-01-28 NOTE — Progress Notes (Signed)
 "   Subjective:  Patient ID: Caleb Ballard, male    DOB: 11-24-1966  Age: 59 y.o. MRN: [id number]  CC:   Chief Complaint  Patient presents with   Follow-up    Patient is here for a 6 month follow up visit. No concerns stated     HPI:  59 year old male presents for follow-up.  Patient states that he is doing well.  He states that he is tired from getting off work but is overall doing well.  He reports that he is taking care of his mother and father.  His current work schedule allows him to do so.  BP is fairly well-controlled.  He remains on amlodipine  as well as benazepril /HCTZ.  A1c has been stable.  No side effects from treatment.  Needs A1c today.  LDL at goal on atorvastatin .  Patient Active Problem List   Diagnosis Date Noted   Obesity (BMI 30-39.9) 10/13/2022   Chronic low back pain with left-sided sciatica 10/13/2022   Diabetes (HCC) 04/13/2013   Hyperlipidemia 02/14/2007   Essential hypertension 02/14/2007   GERD 02/14/2007    Social Hx   Social History   Socioeconomic History   Marital status: Single    Spouse name: Not on file   Number of children: Not on file   Years of education: Not on file   Highest education level: Not on file  Occupational History   Not on file  Tobacco Use   Smoking status: Never   Smokeless tobacco: Never  Vaping Use   Vaping status: Never Used  Substance and Sexual Activity   Alcohol  use: Not Currently   Drug use: Not Currently   Sexual activity: Not on file  Other Topics Concern   Not on file  Social History Narrative   Not on file   Social Drivers of Health   Tobacco Use: Low Risk (04/18/2024)   Patient History    Smoking Tobacco Use: Never    Smokeless Tobacco Use: Never    Passive Exposure: Not on file  Financial Resource Strain: Not on file  Food Insecurity: Not on file  Transportation Needs: Not on file  Physical Activity: Not on file  Stress: Not on file  Social Connections: Not on file  Depression  (PHQ2-9): Low Risk (01/28/2025)   Depression (PHQ2-9)    PHQ-2 Score: 1  Alcohol  Screen: Not on file  Housing: Not on file  Utilities: Not on file  Health Literacy: Not on file    Review of Systems Per HPI  Objective:  BP 132/88 (BP Location: Right Arm, Cuff Size: Large)   Pulse 82   Temp 97.8 F (36.6 C)   Ht 6' (1.829 m)   Wt 276 lb 6 oz (125.4 kg)   SpO2 99%   BMI 37.48 kg/m      01/28/2025    8:37 AM 01/28/2025    8:15 AM 07/24/2024    9:39 AM  BP/Weight  Systolic BP 132 148 118  Diastolic BP 88 78 74  Wt. (Lbs)  276.38   BMI  37.48 kg/m2     Physical Exam Vitals and nursing note reviewed.  Constitutional:      General: He is not in acute distress.    Appearance: Normal appearance.  HENT:     Head: Normocephalic and atraumatic.  Eyes:     General:        Right eye: No discharge.        Left eye: No discharge.  Conjunctiva/sclera: Conjunctivae normal.  Cardiovascular:     Rate and Rhythm: Normal rate and regular rhythm.  Pulmonary:     Effort: Pulmonary effort is normal.     Breath sounds: Normal breath sounds. No wheezing, rhonchi or rales.  Neurological:     Mental Status: He is alert.  Psychiatric:        Mood and Affect: Mood normal.        Behavior: Behavior normal.   Diabetic foot exam was performed.  No deformities or other abnormal visual findings.  Posterior tibialis and dorsalis pulse intact bilaterally.  Intact to touch and monofilament testing bilaterally.    Lab Results  Component Value Date   WBC 10.9 (H) 01/14/2024   HGB 13.9 01/14/2024   HCT 44.3 01/14/2024   PLT 350 01/14/2024   GLUCOSE 113 (H) 01/14/2024   CHOL 130 01/14/2024   TRIG 185 (H) 01/14/2024   HDL 31 (L) 01/14/2024   LDLCALC 68 01/14/2024   ALT 23 01/14/2024   AST 21 01/14/2024   NA 143 01/14/2024   K 4.2 01/14/2024   CL 100 01/14/2024   CREATININE 1.00 01/14/2024   BUN 9 01/14/2024   CO2 25 01/14/2024   TSH 2.561 08/07/2009   PSA 1.2 02/10/2015   HGBA1C  6.9 (H) 07/24/2024     Assessment & Plan:  Type 2 diabetes mellitus without complication, without long-term current use of insulin (HCC) Assessment & Plan: Stable.  Medications refilled.  A1c ordered.  Diabetic foot exam performed today.  Orders: -     CMP14+EGFR -     Hemoglobin A1c -     Microalbumin / creatinine urine ratio -     metFORMIN  HCl; TAKE 2 TABLETS BY MOUTH TWICE DAILY WITH FOOD  Dispense: 360 tablet; Refill: 3 -     glipiZIDE ; Take 2 tablets (10 mg total) by mouth 2 (two) times daily before a meal.  Dispense: 360 tablet; Refill: 3 -     Tirzepatide ; Inject 15 mg into the skin once a week.  Dispense: 6 mL; Refill: 3  Hyperlipidemia, unspecified hyperlipidemia type Assessment & Plan: LDL at goal.  Continue atorvastatin   Orders: -     Lipid panel -     Atorvastatin  Calcium ; Take 1 tablet (40 mg total) by mouth daily.  Dispense: 90 tablet; Refill: 3  Microcytosis -     CBC  Screening PSA (prostate specific antigen) -     PSA  Essential hypertension Assessment & Plan: Stable.  Medications refilled.  Labs ordered.  Orders: -     Benazepril -hydroCHLOROthiazide ; Take 1 tablet by mouth daily.  Dispense: 90 tablet; Refill: 3 -     amLODIPine  Besylate; Take 1 tablet (10 mg total) by mouth daily.  Dispense: 90 tablet; Refill: 3    Follow-up:  6 months  Greg Cratty Bluford DO [facility] "

## 2025-01-30 LAB — CMP14+EGFR
ALT: 23 [IU]/L (ref 0–44)
AST: 23 [IU]/L (ref 0–40)
Albumin: 4.4 g/dL (ref 3.8–4.9)
Alkaline Phosphatase: 87 [IU]/L (ref 47–123)
BUN/Creatinine Ratio: 15 (ref 9–20)
BUN: 14 mg/dL (ref 6–24)
Bilirubin Total: 0.3 mg/dL (ref 0.0–1.2)
CO2: 25 mmol/L (ref 20–29)
Calcium: 9.5 mg/dL (ref 8.7–10.2)
Chloride: 100 mmol/L (ref 96–106)
Creatinine, Ser: 0.92 mg/dL (ref 0.76–1.27)
Globulin, Total: 3.2 g/dL (ref 1.5–4.5)
Glucose: 117 mg/dL — ABNORMAL HIGH (ref 70–99)
Potassium: 4.4 mmol/L (ref 3.5–5.2)
Sodium: 139 mmol/L (ref 134–144)
Total Protein: 7.6 g/dL (ref 6.0–8.5)
eGFR: 97 mL/min/{1.73_m2}

## 2025-01-30 LAB — CBC
Hematocrit: 46.7 % (ref 37.5–51.0)
Hemoglobin: 14.3 g/dL (ref 13.0–17.7)
MCH: 24 pg — ABNORMAL LOW (ref 26.6–33.0)
MCHC: 30.6 g/dL — ABNORMAL LOW (ref 31.5–35.7)
MCV: 78 fL — ABNORMAL LOW (ref 79–97)
Platelets: 336 10*3/uL (ref 150–450)
RBC: 5.96 x10E6/uL — ABNORMAL HIGH (ref 4.14–5.80)
RDW: 15.8 % — ABNORMAL HIGH (ref 11.6–15.4)
WBC: 10.1 10*3/uL (ref 3.4–10.8)

## 2025-01-30 LAB — MICROALBUMIN / CREATININE URINE RATIO
Creatinine, Urine: 247.1 mg/dL
Microalb/Creat Ratio: 8 mg/g{creat} (ref 0–29)
Microalbumin, Urine: 21 ug/mL

## 2025-01-30 LAB — HEMOGLOBIN A1C
Est. average glucose Bld gHb Est-mCnc: 146 mg/dL
Hgb A1c MFr Bld: 6.7 % — ABNORMAL HIGH (ref 4.8–5.6)

## 2025-01-30 LAB — LIPID PANEL
Chol/HDL Ratio: 5.7 ratio — ABNORMAL HIGH (ref 0.0–5.0)
Cholesterol, Total: 201 mg/dL — ABNORMAL HIGH (ref 100–199)
HDL: 35 mg/dL — ABNORMAL LOW
LDL Chol Calc (NIH): 138 mg/dL — ABNORMAL HIGH (ref 0–99)
Triglycerides: 153 mg/dL — ABNORMAL HIGH (ref 0–149)
VLDL Cholesterol Cal: 28 mg/dL (ref 5–40)

## 2025-01-30 LAB — PSA: Prostate Specific Ag, Serum: 2.8 ng/mL (ref 0.0–4.0)

## 2025-07-28 ENCOUNTER — Ambulatory Visit: Admitting: Family Medicine
